# Patient Record
Sex: Female | Born: 1935 | Race: White | Hispanic: No | Marital: Single
Health system: Southern US, Community
[De-identification: ages and names within clinical notes are randomized; demographics above are authoritative.]

## PROBLEM LIST (undated history)

## (undated) DIAGNOSIS — N183 Chronic kidney disease, stage 3 (moderate): Secondary | ICD-10-CM

## (undated) DIAGNOSIS — E559 Vitamin D deficiency, unspecified: Secondary | ICD-10-CM

## (undated) DIAGNOSIS — R532 Functional quadriplegia: Secondary | ICD-10-CM

## (undated) DIAGNOSIS — M216X9 Other acquired deformities of unspecified foot: Secondary | ICD-10-CM

## (undated) DIAGNOSIS — F0280 Dementia in other diseases classified elsewhere without behavioral disturbance: Secondary | ICD-10-CM

## (undated) DIAGNOSIS — E7800 Pure hypercholesterolemia, unspecified: Secondary | ICD-10-CM

## (undated) DIAGNOSIS — G809 Cerebral palsy, unspecified: Secondary | ICD-10-CM

## (undated) DIAGNOSIS — R1319 Other dysphagia: Secondary | ICD-10-CM

## (undated) DIAGNOSIS — G309 Alzheimer's disease, unspecified: Secondary | ICD-10-CM

## (undated) DIAGNOSIS — I2510 Atherosclerotic heart disease of native coronary artery without angina pectoris: Secondary | ICD-10-CM

## (undated) DIAGNOSIS — M2450 Contracture, unspecified joint: Secondary | ICD-10-CM

## (undated) DIAGNOSIS — M1990 Unspecified osteoarthritis, unspecified site: Secondary | ICD-10-CM

## (undated) DIAGNOSIS — D179 Benign lipomatous neoplasm, unspecified: Secondary | ICD-10-CM

## (undated) DIAGNOSIS — I10 Essential (primary) hypertension: Secondary | ICD-10-CM

## (undated) DIAGNOSIS — S42409A Unspecified fracture of lower end of unspecified humerus, initial encounter for closed fracture: Secondary | ICD-10-CM

## (undated) DIAGNOSIS — K5730 Diverticulosis of large intestine without perforation or abscess without bleeding: Secondary | ICD-10-CM

## (undated) DIAGNOSIS — E876 Hypokalemia: Secondary | ICD-10-CM

## (undated) DIAGNOSIS — K449 Diaphragmatic hernia without obstruction or gangrene: Secondary | ICD-10-CM

## (undated) DIAGNOSIS — M810 Age-related osteoporosis without current pathological fracture: Secondary | ICD-10-CM

## (undated) DIAGNOSIS — F329 Major depressive disorder, single episode, unspecified: Secondary | ICD-10-CM

## (undated) DIAGNOSIS — F2089 Other schizophrenia: Secondary | ICD-10-CM

## (undated) DIAGNOSIS — G609 Hereditary and idiopathic neuropathy, unspecified: Secondary | ICD-10-CM

## (undated) DIAGNOSIS — R5381 Other malaise: Secondary | ICD-10-CM

## (undated) DIAGNOSIS — M7981 Nontraumatic hematoma of soft tissue: Secondary | ICD-10-CM

## (undated) DIAGNOSIS — F319 Bipolar disorder, unspecified: Secondary | ICD-10-CM

## (undated) DIAGNOSIS — M545 Low back pain: Secondary | ICD-10-CM

## (undated) DIAGNOSIS — K5900 Constipation, unspecified: Secondary | ICD-10-CM

## (undated) DIAGNOSIS — M4120 Other idiopathic scoliosis, site unspecified: Secondary | ICD-10-CM

## (undated) HISTORY — DX: Functional quadriplegia: R53.2

## (undated) HISTORY — DX: Constipation, unspecified: K59.00

## (undated) HISTORY — DX: Dementia in other diseases classified elsewhere without behavioral disturbance: F02.80

## (undated) HISTORY — DX: Atherosclerotic heart disease of native coronary artery without angina pectoris: I25.10

## (undated) HISTORY — DX: Other idiopathic scoliosis, site unspecified: M41.20

## (undated) HISTORY — DX: Low back pain: M54.5

## (undated) HISTORY — DX: Diaphragmatic hernia without obstruction or gangrene: K44.9

## (undated) HISTORY — DX: Major depressive disorder, single episode, unspecified: F32.9

## (undated) HISTORY — DX: Bipolar disorder, unspecified: F31.9

## (undated) HISTORY — DX: Diverticulosis of large intestine without perforation or abscess without bleeding: K57.30

## (undated) HISTORY — DX: Contracture, unspecified joint: M24.50

## (undated) HISTORY — DX: Other acquired deformities of unspecified foot: M21.6X9

## (undated) HISTORY — DX: Other dysphagia: R13.19

## (undated) HISTORY — DX: Age-related osteoporosis without current pathological fracture: M81.0

## (undated) HISTORY — DX: Pure hypercholesterolemia, unspecified: E78.00

## (undated) HISTORY — DX: Alzheimer's disease, unspecified: G30.9

## (undated) HISTORY — DX: Essential (primary) hypertension: I10

## (undated) HISTORY — DX: Chronic kidney disease, stage 3 (moderate): N18.3

## (undated) HISTORY — DX: Cerebral palsy, unspecified: G80.9

## (undated) HISTORY — DX: Hereditary and idiopathic neuropathy, unspecified: G60.9

## (undated) HISTORY — DX: Unspecified osteoarthritis, unspecified site: M19.90

## (undated) HISTORY — DX: Nontraumatic hematoma of soft tissue: M79.81

## (undated) HISTORY — DX: Unspecified fracture of lower end of unspecified humerus, initial encounter for closed fracture: S42.409A

## (undated) HISTORY — DX: Other schizophrenia: F20.89

## (undated) HISTORY — DX: Benign lipomatous neoplasm, unspecified: D17.9

## (undated) HISTORY — DX: Hypokalemia: E87.6

## (undated) HISTORY — DX: Vitamin D deficiency, unspecified: E55.9

## (undated) HISTORY — DX: Other malaise: R53.81

---

## 1958-11-20 HISTORY — PX: ABDOMINAL HYSTERECTOMY: SHX81

## 1979-10-29 DIAGNOSIS — M4120 Other idiopathic scoliosis, site unspecified: Secondary | ICD-10-CM

## 1979-10-29 DIAGNOSIS — G809 Cerebral palsy, unspecified: Secondary | ICD-10-CM

## 1979-10-29 HISTORY — DX: Cerebral palsy, unspecified: G80.9

## 1979-10-29 HISTORY — DX: Other idiopathic scoliosis, site unspecified: M41.20

## 1998-05-17 ENCOUNTER — Encounter (HOSPITAL_COMMUNITY): Admission: RE | Admit: 1998-05-17 | Discharge: 1998-08-15 | Payer: Self-pay | Admitting: Internal Medicine

## 2000-08-20 DIAGNOSIS — M810 Age-related osteoporosis without current pathological fracture: Secondary | ICD-10-CM

## 2000-08-20 HISTORY — DX: Age-related osteoporosis without current pathological fracture: M81.0

## 2001-12-09 ENCOUNTER — Emergency Department (HOSPITAL_COMMUNITY): Admission: EM | Admit: 2001-12-09 | Discharge: 2001-12-09 | Payer: Self-pay | Admitting: Emergency Medicine

## 2001-12-09 ENCOUNTER — Encounter: Payer: Self-pay | Admitting: Emergency Medicine

## 2002-08-04 DIAGNOSIS — E876 Hypokalemia: Secondary | ICD-10-CM

## 2002-08-04 HISTORY — DX: Hypokalemia: E87.6

## 2003-03-26 DIAGNOSIS — F0280 Dementia in other diseases classified elsewhere without behavioral disturbance: Secondary | ICD-10-CM

## 2003-03-26 DIAGNOSIS — M1990 Unspecified osteoarthritis, unspecified site: Secondary | ICD-10-CM

## 2003-03-26 DIAGNOSIS — I10 Essential (primary) hypertension: Secondary | ICD-10-CM

## 2003-03-26 HISTORY — DX: Dementia in other diseases classified elsewhere without behavioral disturbance: F02.80

## 2003-03-26 HISTORY — DX: Unspecified osteoarthritis, unspecified site: M19.90

## 2003-03-26 HISTORY — DX: Essential (primary) hypertension: I10

## 2003-06-04 DIAGNOSIS — E7800 Pure hypercholesterolemia, unspecified: Secondary | ICD-10-CM

## 2003-06-04 DIAGNOSIS — K5730 Diverticulosis of large intestine without perforation or abscess without bleeding: Secondary | ICD-10-CM

## 2003-06-04 DIAGNOSIS — F329 Major depressive disorder, single episode, unspecified: Secondary | ICD-10-CM

## 2003-06-04 DIAGNOSIS — K449 Diaphragmatic hernia without obstruction or gangrene: Secondary | ICD-10-CM

## 2003-06-04 DIAGNOSIS — D179 Benign lipomatous neoplasm, unspecified: Secondary | ICD-10-CM

## 2003-06-04 HISTORY — DX: Benign lipomatous neoplasm, unspecified: D17.9

## 2003-06-04 HISTORY — DX: Diaphragmatic hernia without obstruction or gangrene: K44.9

## 2003-06-04 HISTORY — DX: Diverticulosis of large intestine without perforation or abscess without bleeding: K57.30

## 2003-06-04 HISTORY — DX: Pure hypercholesterolemia, unspecified: E78.00

## 2003-06-04 HISTORY — DX: Major depressive disorder, single episode, unspecified: F32.9

## 2004-11-26 ENCOUNTER — Emergency Department (HOSPITAL_COMMUNITY): Admission: EM | Admit: 2004-11-26 | Discharge: 2004-11-26 | Payer: Self-pay | Admitting: Emergency Medicine

## 2005-12-17 DIAGNOSIS — F2089 Other schizophrenia: Secondary | ICD-10-CM

## 2005-12-17 HISTORY — DX: Other schizophrenia: F20.89

## 2006-05-05 DIAGNOSIS — M545 Low back pain, unspecified: Secondary | ICD-10-CM

## 2006-05-05 HISTORY — DX: Low back pain, unspecified: M54.50

## 2007-02-22 ENCOUNTER — Emergency Department (HOSPITAL_COMMUNITY): Admission: EM | Admit: 2007-02-22 | Discharge: 2007-02-23 | Payer: Self-pay | Admitting: Emergency Medicine

## 2007-03-12 ENCOUNTER — Inpatient Hospital Stay (HOSPITAL_COMMUNITY): Admission: RE | Admit: 2007-03-12 | Discharge: 2007-03-13 | Payer: Self-pay | Admitting: Orthopedic Surgery

## 2007-03-12 HISTORY — PX: FRACTURE SURGERY: SHX138

## 2008-08-13 DIAGNOSIS — G609 Hereditary and idiopathic neuropathy, unspecified: Secondary | ICD-10-CM

## 2008-08-13 DIAGNOSIS — M216X9 Other acquired deformities of unspecified foot: Secondary | ICD-10-CM

## 2008-08-13 HISTORY — DX: Other acquired deformities of unspecified foot: M21.6X9

## 2008-08-13 HISTORY — DX: Hereditary and idiopathic neuropathy, unspecified: G60.9

## 2008-09-18 IMAGING — CR DG HUMERUS 2V *L*
6 series · 6 of 6 positions shown · non-contrast
Comparison: none

CLINICAL DATA: Left humeral injury with visible deformity. 
 LEFT HUMERUS ? 2 VIEW:

[w transthoracic humerus]
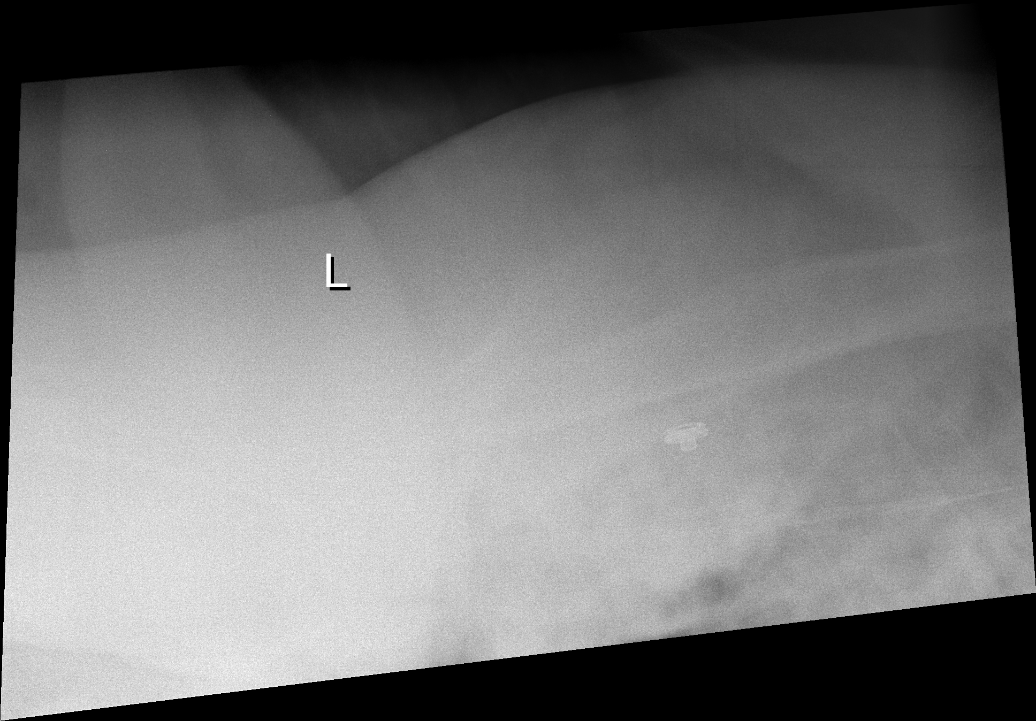

[w transthoracic humerus *]
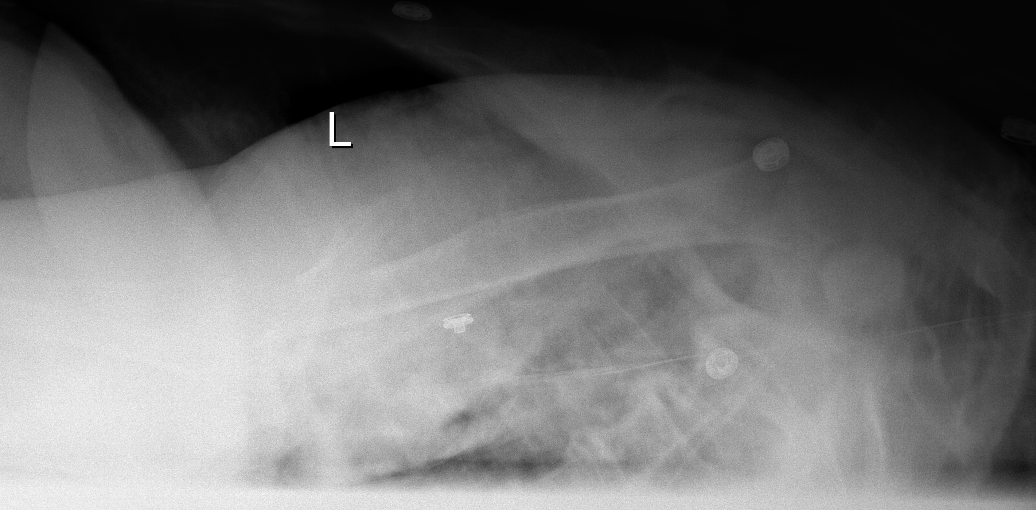

[view not recorded (1 of 4)]
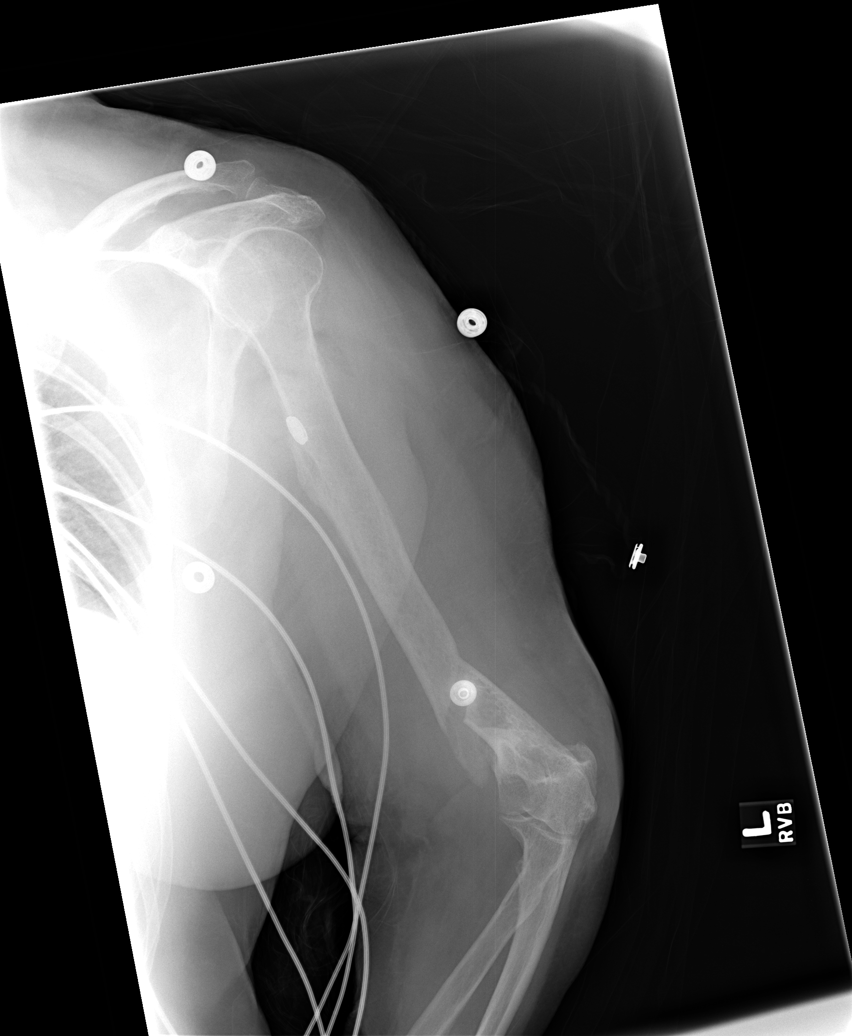

[view not recorded (2 of 4)]
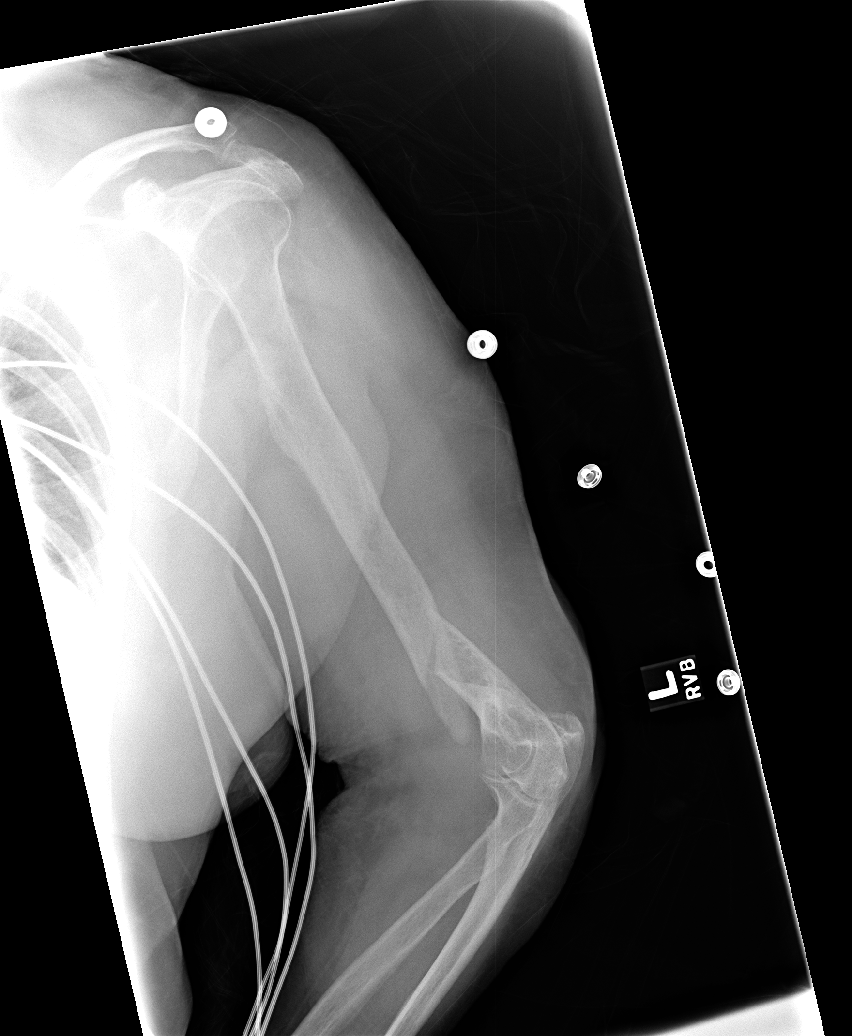

[view not recorded (3 of 4)]
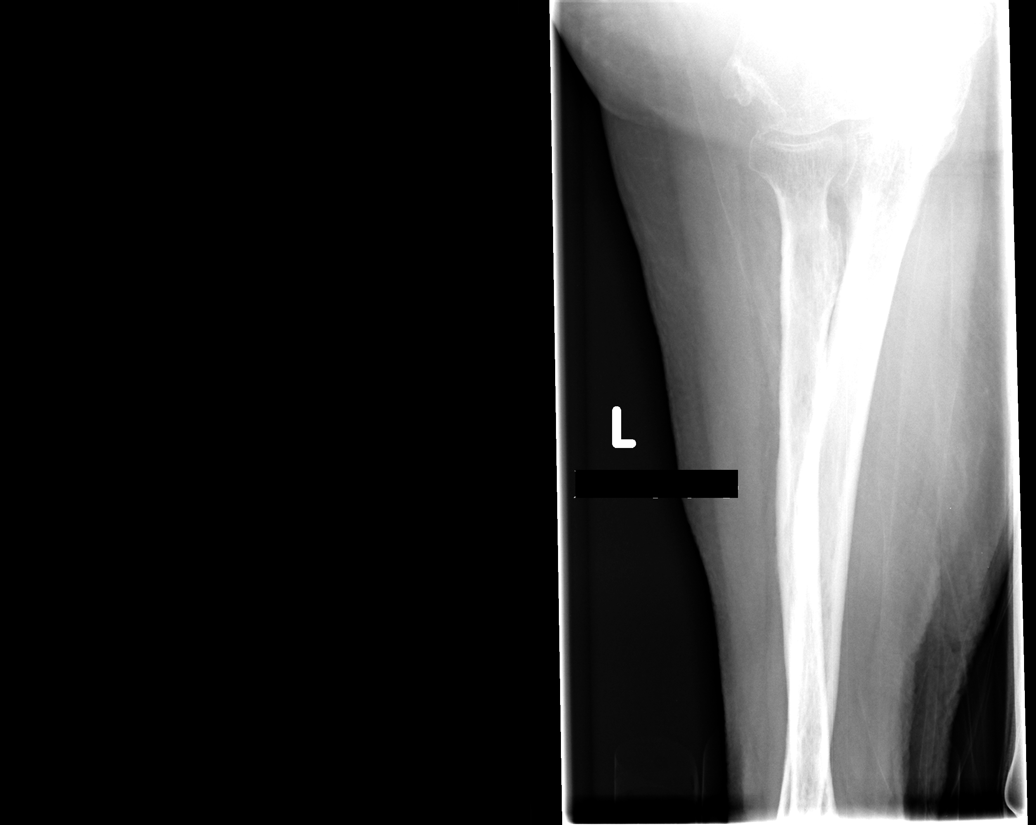

[view not recorded (4 of 4)]
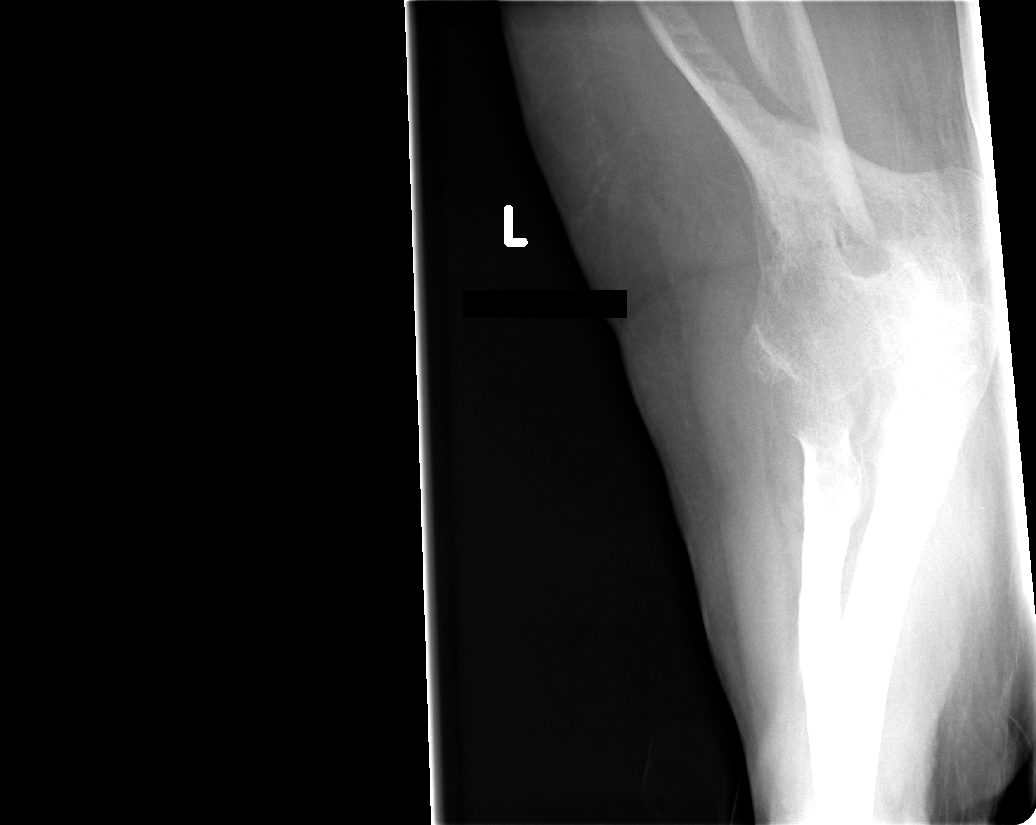

[6 of 6 positions shown; findings below may reference images not displayed]

FINDINGS: There is a displaced and angulated fracture of the distal humerus with distal extent nearly to the supracondylar region.  No dislocation at the elbow.  The proximal humerus is intact.
IMPRESSION: Angulated and displaced distal humeral fracture.

## 2009-03-02 DIAGNOSIS — M2450 Contracture, unspecified joint: Secondary | ICD-10-CM

## 2009-03-02 HISTORY — DX: Contracture, unspecified joint: M24.50

## 2010-06-07 DIAGNOSIS — E559 Vitamin D deficiency, unspecified: Secondary | ICD-10-CM

## 2010-06-07 HISTORY — DX: Vitamin D deficiency, unspecified: E55.9

## 2012-06-10 DIAGNOSIS — I2510 Atherosclerotic heart disease of native coronary artery without angina pectoris: Secondary | ICD-10-CM

## 2012-06-10 DIAGNOSIS — M7981 Nontraumatic hematoma of soft tissue: Secondary | ICD-10-CM

## 2012-06-10 DIAGNOSIS — R1319 Other dysphagia: Secondary | ICD-10-CM

## 2012-06-10 DIAGNOSIS — F319 Bipolar disorder, unspecified: Secondary | ICD-10-CM

## 2012-06-10 DIAGNOSIS — K5900 Constipation, unspecified: Secondary | ICD-10-CM

## 2012-06-10 DIAGNOSIS — R5381 Other malaise: Secondary | ICD-10-CM

## 2012-06-10 HISTORY — DX: Atherosclerotic heart disease of native coronary artery without angina pectoris: I25.10

## 2012-06-10 HISTORY — DX: Bipolar disorder, unspecified: F31.9

## 2012-06-10 HISTORY — DX: Constipation, unspecified: K59.00

## 2012-06-10 HISTORY — DX: Other malaise: R53.81

## 2012-06-10 HISTORY — DX: Nontraumatic hematoma of soft tissue: M79.81

## 2012-06-10 HISTORY — DX: Other dysphagia: R13.19

## 2012-10-16 DIAGNOSIS — N183 Chronic kidney disease, stage 3 unspecified: Secondary | ICD-10-CM

## 2012-10-16 HISTORY — DX: Chronic kidney disease, stage 3 unspecified: N18.30

## 2013-02-17 DIAGNOSIS — F339 Major depressive disorder, recurrent, unspecified: Secondary | ICD-10-CM

## 2013-02-17 DIAGNOSIS — L219 Seborrheic dermatitis, unspecified: Secondary | ICD-10-CM

## 2013-02-17 DIAGNOSIS — M159 Polyosteoarthritis, unspecified: Secondary | ICD-10-CM

## 2013-02-17 DIAGNOSIS — R21 Rash and other nonspecific skin eruption: Secondary | ICD-10-CM

## 2013-03-21 DIAGNOSIS — N183 Chronic kidney disease, stage 3 (moderate): Secondary | ICD-10-CM

## 2013-03-21 DIAGNOSIS — I129 Hypertensive chronic kidney disease with stage 1 through stage 4 chronic kidney disease, or unspecified chronic kidney disease: Secondary | ICD-10-CM

## 2013-03-21 DIAGNOSIS — F2089 Other schizophrenia: Secondary | ICD-10-CM

## 2013-03-21 DIAGNOSIS — D638 Anemia in other chronic diseases classified elsewhere: Secondary | ICD-10-CM

## 2013-03-21 DIAGNOSIS — L219 Seborrheic dermatitis, unspecified: Secondary | ICD-10-CM

## 2013-03-27 ENCOUNTER — Other Ambulatory Visit: Payer: Self-pay | Admitting: Geriatric Medicine

## 2013-03-27 MED ORDER — LORAZEPAM 0.5 MG PO TABS: 0.5000 mg | ORAL_TABLET | Freq: Three times a day (TID) | ORAL | Status: DC

## 2013-03-31 ENCOUNTER — Other Ambulatory Visit: Payer: Self-pay | Admitting: *Deleted

## 2013-03-31 MED ORDER — HYDROCODONE-ACETAMINOPHEN 5-325 MG PO TABS: ORAL_TABLET | ORAL | Status: DC

## 2013-04-16 ENCOUNTER — Other Ambulatory Visit: Payer: Self-pay | Admitting: Geriatric Medicine

## 2013-04-16 MED ORDER — LORAZEPAM 0.5 MG PO TABS: ORAL_TABLET | ORAL | Status: DC

## 2013-04-24 DIAGNOSIS — R532 Functional quadriplegia: Secondary | ICD-10-CM

## 2013-04-24 DIAGNOSIS — F0280 Dementia in other diseases classified elsewhere without behavioral disturbance: Secondary | ICD-10-CM

## 2013-04-24 DIAGNOSIS — F339 Major depressive disorder, recurrent, unspecified: Secondary | ICD-10-CM

## 2013-04-24 DIAGNOSIS — L219 Seborrheic dermatitis, unspecified: Secondary | ICD-10-CM

## 2013-04-24 DIAGNOSIS — G309 Alzheimer's disease, unspecified: Secondary | ICD-10-CM

## 2013-05-30 ENCOUNTER — Encounter: Payer: Self-pay | Admitting: *Deleted

## 2013-06-02 ENCOUNTER — Encounter: Payer: Self-pay | Admitting: *Deleted

## 2013-06-09 ENCOUNTER — Non-Acute Institutional Stay (SKILLED_NURSING_FACILITY): Payer: PRIVATE HEALTH INSURANCE | Admitting: Internal Medicine

## 2013-06-09 DIAGNOSIS — F200 Paranoid schizophrenia: Secondary | ICD-10-CM

## 2013-06-09 DIAGNOSIS — I10 Essential (primary) hypertension: Secondary | ICD-10-CM

## 2013-06-09 DIAGNOSIS — N184 Chronic kidney disease, stage 4 (severe): Secondary | ICD-10-CM

## 2013-06-09 DIAGNOSIS — E7800 Pure hypercholesterolemia, unspecified: Secondary | ICD-10-CM

## 2013-06-09 NOTE — Progress Notes (Signed)
Patient ID: Kristen Owens, female   DOB: Dec 16, 1929, 79 y.o.   MRN: [id number]  [facility]  Chief Complaint  Patient presents with  . Medical Managment of Chronic Issues   Allergies  Allergen Reactions  . Carbapenems   . Cephalosporins   . Penicillamine   . Penicillins    Code status- DNR  HPI 79 y/o female is a LTC resident here. She is at her functional baseline and there are no new concerns from the staff. She is working with restorative team. She is under total care.  No falls or any behavioral changes reported.  Unable to obtain ROS from her but as per staff, she is able to make her needs known  Past Medical History  Diagnosis Date  . Chronic kidney disease, stage III (moderate) 10/16/2012  . Bipolar I disorder, most recent episode (or current) unspecified 06/10/2012  . Coronary atherosclerosis of native coronary artery 06/10/2012  . Unspecified constipation 06/10/2012  . Nontraumatic hematoma of soft tissue 06/10/2012  . Other dysphagia 06/10/2012  . Debility, unspecified 06/10/2012  . Unspecified vitamin D deficiency 06/07/2010  . Contracture of joint 03/02/2009  . Unspecified hereditary and idiopathic peripheral neuropathy 08/13/2008  . Other acquired deformity of ankle and foot(736.79) 08/13/2008  . Closed fracture of unspecified part of lower end of humerus 08/13/2008`  . Lumbago 05/05/2006  . Simple schizophrenia, unspecified condition 12/17/2005  . Lipoma of unspecified site 06/04/2003  . Pure hypercholesterolemia 06/04/2003  . Depressive disorder, not elsewhere classified 06/04/2003  . Diaphragmatic hernia without mention of obstruction or gangrene 06/04/2003  . Diverticulosis of colon (without mention of hemorrhage) 06/04/2003  . Alzheimer's disease 03/26/2003  . Unspecified essential hypertension 03/26/2003  . Osteoarthrosis, unspecified whether generalized or localized, unspecified site 03/26/2003  . Hypopotassemia 08/04/2002  . Osteoporosis,  unspecified 08/20/2000  . Infantile cerebral palsy, unspecified 10/29/1979  . Scoliosis (and kyphoscoliosis), idiopathic 10/29/1979  . Functional quadriplegia    Physical Exam  Constitutional: She appears well-developed. No distress.  In bed, has garbled speech  HENT:  Head: Normocephalic and atraumatic.  Mouth/Throat: Oropharynx is clear and moist.  Eyes: Conjunctivae are normal.  Neck: Normal range of motion. Neck supple. No tracheal deviation present.  Cardiovascular: Normal rate, regular rhythm and intact distal pulses.   Pulmonary/Chest: Effort normal and breath sounds normal. No respiratory distress.  Abdominal: Soft. Bowel sounds are normal. She exhibits no mass.  Musculoskeletal: She exhibits no edema.  Contracture of LUE, right 2nd PIP and 3-5 th fingers contracted, b/l foot drop, unable to move LE  Lymphadenopathy:    She has no cervical adenopathy.  Neurological:  Calm and awake, able to answer simple questions  Skin: Skin is warm and dry. She is not diaphoretic.   VS- bp 130/65, HR 59/min, rr 16/min  Labs and medication reviewed  Assessment/plan  HTN- bp well controlled at present. Monitor renal function. Continue cozaar and kcl supplement.   Schizophrenia- calm at present, continue risperdal and lamictal with prn ativan  Osteoporosis, fall precautions, continue ca-vit d  Hyperlipidemia- continue zocor and monitor flp periodically  ckd stage 3- avoid nsaids, continue cozaar, monitor bmp

## 2013-06-11 DIAGNOSIS — E7800 Pure hypercholesterolemia, unspecified: Secondary | ICD-10-CM | POA: Insufficient documentation

## 2013-06-11 DIAGNOSIS — F0280 Dementia in other diseases classified elsewhere without behavioral disturbance: Secondary | ICD-10-CM | POA: Insufficient documentation

## 2013-06-11 DIAGNOSIS — G3183 Dementia with Lewy bodies: Secondary | ICD-10-CM

## 2013-06-11 DIAGNOSIS — N184 Chronic kidney disease, stage 4 (severe): Secondary | ICD-10-CM | POA: Insufficient documentation

## 2013-06-11 DIAGNOSIS — E785 Hyperlipidemia, unspecified: Secondary | ICD-10-CM | POA: Insufficient documentation

## 2013-06-11 DIAGNOSIS — M810 Age-related osteoporosis without current pathological fracture: Secondary | ICD-10-CM | POA: Insufficient documentation

## 2013-06-11 DIAGNOSIS — I10 Essential (primary) hypertension: Secondary | ICD-10-CM | POA: Insufficient documentation

## 2013-06-11 DIAGNOSIS — F329 Major depressive disorder, single episode, unspecified: Secondary | ICD-10-CM | POA: Insufficient documentation

## 2013-06-11 DIAGNOSIS — K5790 Diverticulosis of intestine, part unspecified, without perforation or abscess without bleeding: Secondary | ICD-10-CM | POA: Insufficient documentation

## 2013-06-11 DIAGNOSIS — K449 Diaphragmatic hernia without obstruction or gangrene: Secondary | ICD-10-CM | POA: Insufficient documentation

## 2013-06-11 DIAGNOSIS — G808 Other cerebral palsy: Secondary | ICD-10-CM | POA: Insufficient documentation

## 2013-06-11 DIAGNOSIS — F319 Bipolar disorder, unspecified: Secondary | ICD-10-CM | POA: Insufficient documentation

## 2013-06-11 DIAGNOSIS — F200 Paranoid schizophrenia: Secondary | ICD-10-CM | POA: Insufficient documentation

## 2013-07-14 ENCOUNTER — Non-Acute Institutional Stay (SKILLED_NURSING_FACILITY): Payer: PRIVATE HEALTH INSURANCE | Admitting: Internal Medicine

## 2013-07-14 DIAGNOSIS — E7800 Pure hypercholesterolemia, unspecified: Secondary | ICD-10-CM

## 2013-07-14 DIAGNOSIS — F200 Paranoid schizophrenia: Secondary | ICD-10-CM

## 2013-07-14 DIAGNOSIS — K5900 Constipation, unspecified: Secondary | ICD-10-CM

## 2013-07-14 DIAGNOSIS — R532 Functional quadriplegia: Secondary | ICD-10-CM

## 2013-07-14 DIAGNOSIS — E876 Hypokalemia: Secondary | ICD-10-CM

## 2013-07-14 DIAGNOSIS — M810 Age-related osteoporosis without current pathological fracture: Secondary | ICD-10-CM

## 2013-07-14 DIAGNOSIS — Z0000 Encounter for general adult medical examination without abnormal findings: Secondary | ICD-10-CM

## 2013-07-14 DIAGNOSIS — E785 Hyperlipidemia, unspecified: Secondary | ICD-10-CM

## 2013-07-14 DIAGNOSIS — I10 Essential (primary) hypertension: Secondary | ICD-10-CM

## 2013-07-14 DIAGNOSIS — K219 Gastro-esophageal reflux disease without esophagitis: Secondary | ICD-10-CM

## 2013-07-14 DIAGNOSIS — N184 Chronic kidney disease, stage 4 (severe): Secondary | ICD-10-CM

## 2013-07-14 NOTE — Progress Notes (Signed)
Patient ID: Kristen Owens, female   DOB: 10/08/1930, 78 y.o.   MRN: [id number]  [facility]    Code Status:   Allergies  Allergen Reactions  . Carbapenems   . Cephalosporins   . Penicillamine   . Penicillins     Chief Complaint: dnr   HPI 79 y/o female is a LTC resident here. She is at her functional baseline and there are no new concerns from the staff. She is working with restorative team. She is under total care.  No falls or any behavioral changes reported.  Unable to obtain ROS from her but as per staff, she is able to make her needs known. Weight has been stable. She is not in any distress.    uptodate with flu and pneumococcal vaccine. uptodate with eye and foot exam  Past Medical History  Diagnosis Date  . Chronic kidney disease, stage III (moderate) 10/16/2012  . Bipolar I disorder, most recent episode (or current) unspecified 06/10/2012  . Coronary atherosclerosis of native coronary artery 06/10/2012  . Unspecified constipation 06/10/2012  . Nontraumatic hematoma of soft tissue 06/10/2012  . Other dysphagia 06/10/2012  . Debility, unspecified 06/10/2012  . Unspecified vitamin D deficiency 06/07/2010  . Contracture of joint 03/02/2009  . Unspecified hereditary and idiopathic peripheral neuropathy 08/13/2008  . Other acquired deformity of ankle and foot(736.79) 08/13/2008  . Closed fracture of unspecified part of lower end of humerus 08/13/2008`  . Lumbago 05/05/2006  . Simple schizophrenia, unspecified condition 12/17/2005  . Lipoma of unspecified site 06/04/2003  . Pure hypercholesterolemia 06/04/2003  . Depressive disorder, not elsewhere classified 06/04/2003  . Diaphragmatic hernia without mention of obstruction or gangrene 06/04/2003  . Diverticulosis of colon (without mention of hemorrhage) 06/04/2003  . Alzheimer's disease 03/26/2003  . Unspecified essential hypertension 03/26/2003  . Osteoarthrosis, unspecified whether generalized or localized,  unspecified site 03/26/2003  . Hypopotassemia 08/04/2002  . Osteoporosis, unspecified 08/20/2000  . Infantile cerebral palsy, unspecified 10/29/1979  . Scoliosis (and kyphoscoliosis), idiopathic 10/29/1979  . Functional quadriplegia    Past Surgical History  Procedure Laterality Date  . Abdominal hysterectomy  1960  . Fracture surgery  03/12/2007   Social History:   has no tobacco, alcohol, and drug history on file.  No family history on file.  Medications: Patient's Medications  New Prescriptions   No medications on file  Previous Medications   ACETAMINOPHEN (TYLENOL) 500 MG TABLET    Take 500 mg by mouth every 6 (six) hours as needed for pain. Take 1-2 tablets every 6 hours as needed for pain. One tablet for mild pain, two tablets for moderate pain.   ASPIRIN 81 MG TABLET    Take 81 mg by mouth daily. Take 1 tablet daily for heart health.   CHOLECALCIFEROL 2000 UNITS CAPS    Take 1 capsule by mouth daily.   DICLOFENAC SODIUM (VOLTAREN) 1 % GEL    Apply topically. Use for pain.   FLUOXETINE (PROZAC) 20 MG TABLET    Take 10 mg by mouth daily.    HYDROCODONE-ACETAMINOPHEN (NORCO/VICODIN) 5-325 MG PER TABLET    Take one tablet by mouth twice daily for pain; Take one tablet by mouth every 6 hours as needed for severe pain.   LAMOTRIGINE (LAMICTAL) 150 MG TABLET    Take 150 mg by mouth daily.   LORAZEPAM (ATIVAN) 0.5 MG TABLET    Take one tablet by mouth three times a day for anxiety; Take one tablet by mouth every 8 hours as  needed for anxiety.   LOSARTAN (COZAAR) 50 MG TABLET    Take 25 mg by mouth daily.    OMEPRAZOLE (PRILOSEC) 20 MG CAPSULE    Take 20 mg by mouth daily.   POTASSIUM CHLORIDE (K-DUR) 10 MEQ TABLET    Take 10 mEq by mouth daily.   RISPERIDONE (RISPERDAL) 1 MG TABLET    Take 1 mg by mouth daily. 1 tab in am and 1 and half tab in pm   SENNA-DOCUSATE (SENNA S) 8.6-50 MG PER TABLET    Take 1 tablet by mouth 2 (two) times daily.    SIMVASTATIN (ZOCOR) 5 MG TABLET    Take  5 mg by mouth at bedtime. Take 1 tablet daily for cholesterol.  Modified Medications   No medications on file  Discontinued Medications   RISPERIDONE (RISPERDAL) 3 MG TABLET    Take 3 mg by mouth 2 (two) times daily. Take 1 tablet twice daily for mood d     Physical Exam: Filed Vitals:   07/14/13 0928  BP: 100/60  Pulse: 72  Temp: 96.8 F (36 C)  Resp: 18  SpO2: 95%   Constitutional: She appears well-developed. No distress.  In bed, has garbled speech  HENT:   Head: Normocephalic and atraumatic.   Mouth/Throat: Oropharynx is clear and moist.  Eyes: Conjunctivae are normal.  Neck: Normal range of motion. Neck supple. No tracheal deviation present.  Cardiovascular: Normal rate, regular rhythm and intact distal pulses.   Pulmonary/Chest: Effort normal and breath sounds normal. No respiratory distress.  Abdominal: Soft. Bowel sounds are normal. She exhibits no mass.  Musculoskeletal: She exhibits no edema.  Contracture of LUE, right 2nd PIP and 3-5 th fingers contracted, b/l foot drop, unable to move LE  Lymphadenopathy:    She has no cervical adenopathy.  Neurological:  Calm and awake, able to answer simple questions  Skin: Skin is warm and dry. She is not diaphoretic.    Labs reviewed: 02/11/13 b12 520, wbc 7.1, hb 12.1, hct 37.0, mcv 91.1, ca 9.4, na 139, k 4.2, bun 23, cr 0.82, glu 97, chol 159, tg 105, ldl 96, hdl 42 a1c 5.15.14 5.2  Assessment/plan  HTN- bp on lower side of normal. On cozaar 50 mg daily. Will decrease it to 25 mg daily for now and monitor bp  Osteoporosis-  Continue ca-vit d supplement, fall precautions  General exam- reviewed routine labs and immunizations. Under total care. Continue skin care, fall precautions  Schizophrenia- continue lamictal, fluoxetine and risperidal with ativan for now, monitor clinically  CKD stage 3- continue current medication regimen. Avoid NSAIDS.   Hyperlipidemia- reviewed lipid panel. Continue zocor  Constipation-  continue senna -s for now  Hypokalemia- continue kcl supplement, monitor bmp periodically  GERD- stable, continue prilosec  Quadriplegia- continue oxycodone for pain prn, bp stable - med adjustment made. Continue asa and statin  Labs/tests ordered- cbc, bmp

## 2013-09-05 ENCOUNTER — Other Ambulatory Visit: Payer: Self-pay | Admitting: *Deleted

## 2013-09-05 MED ORDER — LORAZEPAM 0.5 MG PO TABS: ORAL_TABLET | ORAL | Status: DC

## 2013-09-05 NOTE — Telephone Encounter (Signed)
rx filled per protocol  

## 2013-09-15 ENCOUNTER — Other Ambulatory Visit: Payer: Self-pay | Admitting: *Deleted

## 2013-09-15 MED ORDER — LORAZEPAM 0.5 MG PO TABS: ORAL_TABLET | ORAL | Status: DC

## 2013-09-18 ENCOUNTER — Non-Acute Institutional Stay (SKILLED_NURSING_FACILITY): Payer: PRIVATE HEALTH INSURANCE | Admitting: Internal Medicine

## 2013-09-18 DIAGNOSIS — N189 Chronic kidney disease, unspecified: Secondary | ICD-10-CM

## 2013-09-18 DIAGNOSIS — R532 Functional quadriplegia: Secondary | ICD-10-CM

## 2013-09-18 DIAGNOSIS — I129 Hypertensive chronic kidney disease with stage 1 through stage 4 chronic kidney disease, or unspecified chronic kidney disease: Secondary | ICD-10-CM

## 2013-09-18 DIAGNOSIS — F329 Major depressive disorder, single episode, unspecified: Secondary | ICD-10-CM

## 2013-09-18 DIAGNOSIS — F200 Paranoid schizophrenia: Secondary | ICD-10-CM

## 2013-09-18 DIAGNOSIS — F3289 Other specified depressive episodes: Secondary | ICD-10-CM

## 2013-09-18 DIAGNOSIS — I10 Essential (primary) hypertension: Secondary | ICD-10-CM | POA: Insufficient documentation

## 2013-09-18 DIAGNOSIS — E7800 Pure hypercholesterolemia, unspecified: Secondary | ICD-10-CM

## 2013-09-18 NOTE — Progress Notes (Signed)
Patient ID: Kristen Owens, female   DOB: 12-Apr-1930, 79 y.o.   MRN: [id number]  [facility]  Chief complaint- medical management of chronic illness  Allergies reviewed  Code status- DNR  HPI 79 y/o female is a LTC resident here. She is at her functional baseline and has occassional yelling and calling out names episode. Her ativan was decreased recently as she was somnolent most of the time of the day and now with dose reduction, she has been more awake. She is under total care.  No falls reported. She is tolerating puree diet well  Unable to obtain ROS from her but as per staff, she is able to make her needs known. Weight has been stable. She is not in any distress.    Past Medical History  Diagnosis Date  . Chronic kidney disease, stage III (moderate) 10/16/2012  . Bipolar I disorder, most recent episode (or current) unspecified 06/10/2012  . Coronary atherosclerosis of native coronary artery 06/10/2012  . Unspecified constipation 06/10/2012  . Nontraumatic hematoma of soft tissue 06/10/2012  . Other dysphagia 06/10/2012  . Debility, unspecified 06/10/2012  . Unspecified vitamin D deficiency 06/07/2010  . Contracture of joint 03/02/2009  . Unspecified hereditary and idiopathic peripheral neuropathy 08/13/2008  . Other acquired deformity of ankle and foot(736.79) 08/13/2008  . Closed fracture of unspecified part of lower end of humerus 08/13/2008`  . Lumbago 05/05/2006  . Simple schizophrenia, unspecified condition 12/17/2005  . Lipoma of unspecified site 06/04/2003  . Pure hypercholesterolemia 06/04/2003  . Depressive disorder, not elsewhere classified 06/04/2003  . Diaphragmatic hernia without mention of obstruction or gangrene 06/04/2003  . Diverticulosis of colon (without mention of hemorrhage) 06/04/2003  . Alzheimer's disease 03/26/2003  . Unspecified essential hypertension 03/26/2003  . Osteoarthrosis, unspecified whether generalized or localized,  unspecified site 03/26/2003  . Hypopotassemia 08/04/2002  . Osteoporosis, unspecified 08/20/2000  . Infantile cerebral palsy, unspecified 10/29/1979  . Scoliosis (and kyphoscoliosis), idiopathic 10/29/1979  . Functional quadriplegia    Past Surgical History  Procedure Laterality Date  . Abdominal hysterectomy  1960  . Fracture surgery  03/12/2007   Medication reviewed. See [facility]  Physical exam  Nursing note and vital signs reviewed   BP 170/94  Pulse 71  Temp(Src) 98.5 F (36.9 C)  Resp 18  Constitutional: She appears well-developed. No distress.  In bed, has garbled speech   HENT:   Head: Normocephalic and atraumatic.   Mouth/Throat: Oropharynx is clear and moist.   Eyes: Conjunctivae are normal.   Neck: Normal range of motion. Neck supple. No tracheal deviation present.   Cardiovascular: Normal rate, regular rhythm and intact distal pulses.    Pulmonary/Chest: Effort normal and breath sounds normal. No respiratory distress.   Abdominal: Soft. Bowel sounds are normal. She exhibits no mass.  Musculoskeletal: She exhibits no edema.  Contracture of LUE, right 2nd PIP and 3-5 th fingers contracted, b/l foot drop, unable to move LE  Lymphadenopathy:    She has no cervical adenopathy.  Neurological:  Calm and awake, able to answer simple questions   Skin: Skin is warm and dry. She is not diaphoretic.   Labs reviewed: 02/11/13 b12 520, wbc 7.1, hb 12.1, hct 37.0, mcv 91.1, ca 9.4, na 139, k 4.2, bun 23, cr 0.82, glu 97, chol 159, tg 105, ldl 96, hdl 42 a1c 5.15.14 5.2  Assessment/plan  HTN- bp elevated this visit.she is on cozaar 25 mg daily. Will add amlodipine 5 mg po daily, check bp  daily for now and readjust dose if needed. Goal < 140/90  Psychosis- recent frequent episodes of yelling. Given that benzodiazepine had made her sleepy, will avoid benzos. Will start her on seroquel 12.5 mg bid and reassess. Given standing and prn ativan when needed  Schizophrenia- continue  lamictal 150 mg daily, fluoxetine 10 mg daily and risperidal 1 mg in am and 1.5 mg in pm daily. Continue prn 0.25 mg q8 hr prn   GERD- stable, with her being bed bound will continue prilosec  Osteoporosis-  Continue ca-vit d supplement, fall precautions  Quadriplegia- continue hydrocodone-apap 5-325 bid and prn norco oxycodone for pain prn. Continue asa and statin  CKD stage 3- continue current medication regimen. Avoid NSAIDS.   Hyperlipidemia- Continue zocor  Constipation- continue senna -s for now  Hypokalemia- continue kcl supplement, monitor bmp periodically

## 2013-10-23 ENCOUNTER — Non-Acute Institutional Stay (SKILLED_NURSING_FACILITY): Payer: PRIVATE HEALTH INSURANCE | Admitting: Internal Medicine

## 2013-10-23 DIAGNOSIS — N189 Chronic kidney disease, unspecified: Secondary | ICD-10-CM

## 2013-10-23 DIAGNOSIS — I129 Hypertensive chronic kidney disease with stage 1 through stage 4 chronic kidney disease, or unspecified chronic kidney disease: Secondary | ICD-10-CM

## 2013-10-23 DIAGNOSIS — M810 Age-related osteoporosis without current pathological fracture: Secondary | ICD-10-CM

## 2013-10-23 DIAGNOSIS — F200 Paranoid schizophrenia: Secondary | ICD-10-CM

## 2013-10-23 DIAGNOSIS — R532 Functional quadriplegia: Secondary | ICD-10-CM

## 2013-10-23 NOTE — Progress Notes (Signed)
Patient ID: Kristen Owens, female   DOB: Jul 19, 1930, 79 y.o.   MRN: [id number]   [facility]  Chief complaint- medical management of chronic illness  Allergies reviewed  Code status- DNR  HPI 79 y/o female is a seen for routine visit. She is at her functional baseline and is under total care. She required her ativan intermittently. No new skin concern. She gets pureed diet. No falls reported. Unable to obtain ROS from her but as per staff, she is able to make her needs known. Weight has been stable. She is not in any distress  Past Medical History  Diagnosis Date  . Chronic kidney disease, stage III (moderate) 10/16/2012  . Bipolar I disorder, most recent episode (or current) unspecified 06/10/2012  . Coronary atherosclerosis of native coronary artery 06/10/2012  . Unspecified constipation 06/10/2012  . Nontraumatic hematoma of soft tissue 06/10/2012  . Other dysphagia 06/10/2012  . Debility, unspecified 06/10/2012  . Unspecified vitamin D deficiency 06/07/2010  . Contracture of joint 03/02/2009  . Unspecified hereditary and idiopathic peripheral neuropathy 08/13/2008  . Other acquired deformity of ankle and foot(736.79) 08/13/2008  . Closed fracture of unspecified part of lower end of humerus 08/13/2008`  . Lumbago 05/05/2006  . Simple schizophrenia, unspecified condition 12/17/2005  . Lipoma of unspecified site 06/04/2003  . Pure hypercholesterolemia 06/04/2003  . Depressive disorder, not elsewhere classified 06/04/2003  . Diaphragmatic hernia without mention of obstruction or gangrene 06/04/2003  . Diverticulosis of colon (without mention of hemorrhage) 06/04/2003  . Alzheimer's disease 03/26/2003  . Unspecified essential hypertension 03/26/2003  . Osteoarthrosis, unspecified whether generalized or localized, unspecified site 03/26/2003  . Hypopotassemia 08/04/2002  . Osteoporosis, unspecified 08/20/2000  . Infantile cerebral palsy, unspecified  10/29/1979  . Scoliosis (and kyphoscoliosis), idiopathic 10/29/1979  . Functional quadriplegia    Past Surgical History  Procedure Laterality Date  . Abdominal hysterectomy  1960  . Fracture surgery  03/12/2007   Medication reviewed. See [facility]  Physical exam BP 135/81  Pulse 76  Temp(Src) 97.4 F (36.3 C)  Resp 16  Constitutional: She appears well-developed. No distress.  In bed, has garbled speech   HENT:   Head: Normocephalic and atraumatic.   Mouth/Throat: Oropharynx is clear and moist.   Eyes: Conjunctivae are normal.   Neck: Normal range of motion. Neck supple. No tracheal deviation present.   Cardiovascular: Normal rate, regular rhythm and intact distal pulses.    Pulmonary/Chest: Effort normal and breath sounds normal. No respiratory distress.   Abdominal: Soft. Bowel sounds are normal. She exhibits no mass.  Musculoskeletal: She exhibits no edema.  Contracture of LUE, right 2nd PIP and 3-5 th fingers contracted, b/l foot drop, unable to move LE  Lymphadenopathy:    She has no cervical adenopathy.  Neurological:  Calm and awake, able to answer simple questions   Skin: Skin is warm and dry. She is not diaphoretic.   Labs reviewed: 02/11/13 b12 520, wbc 7.1, hb 12.1, hct 37.0, mcv 91.1, ca 9.4, na 139, k 4.2, bun 23, cr 0.82, glu 97, chol 159, tg 105, ldl 96, hdl 42 a1c 5.15.14 5.2  Assessment/plan  Schizophrenia- continue lamictal, fluoxetine, risperidal and prn ativan. Monitor clinically. Calm this visitHTN- bp elevated this visit.she is on cozaar 25 mg daily. Will add amlodipine 5 mg po daily, check bp daily for now and readjust dose if needed. Goal < 140/90  GERD- stable on prilosec  Quadriplegia- continue hydrocodone-apap 5-325 bid and prn norco oxycodone for  pain prn. Continue asa and statin. bp under control  Osteoporosis-  Continue ca-vit d supplement, fall precautions

## 2013-10-30 ENCOUNTER — Other Ambulatory Visit: Payer: Self-pay | Admitting: *Deleted

## 2013-10-30 MED ORDER — LORAZEPAM 0.5 MG PO TABS: ORAL_TABLET | ORAL | Status: DC

## 2013-11-10 ENCOUNTER — Other Ambulatory Visit: Payer: Self-pay | Admitting: *Deleted

## 2013-11-10 MED ORDER — HYDROCODONE-ACETAMINOPHEN 5-325 MG PO TABS: ORAL_TABLET | ORAL | Status: DC

## 2013-12-08 ENCOUNTER — Non-Acute Institutional Stay (SKILLED_NURSING_FACILITY): Payer: PRIVATE HEALTH INSURANCE | Admitting: Internal Medicine

## 2013-12-08 ENCOUNTER — Encounter: Payer: Self-pay | Admitting: Internal Medicine

## 2013-12-08 DIAGNOSIS — I10 Essential (primary) hypertension: Secondary | ICD-10-CM

## 2013-12-08 DIAGNOSIS — F200 Paranoid schizophrenia: Secondary | ICD-10-CM

## 2013-12-08 DIAGNOSIS — M810 Age-related osteoporosis without current pathological fracture: Secondary | ICD-10-CM

## 2013-12-08 DIAGNOSIS — E785 Hyperlipidemia, unspecified: Secondary | ICD-10-CM

## 2013-12-08 DIAGNOSIS — R532 Functional quadriplegia: Secondary | ICD-10-CM

## 2013-12-08 DIAGNOSIS — K219 Gastro-esophageal reflux disease without esophagitis: Secondary | ICD-10-CM | POA: Insufficient documentation

## 2013-12-08 NOTE — Progress Notes (Signed)
Patient ID: Kristen Owens, female   DOB: 05/09/1930, 80 y.o.   MRN: [id number]    [facility]  Chief complaint- medical management of chronic illness  Allergies reviewed  Code status- DNR  HPI 80 y/o female is a seen for routine visit. She is at her functional baseline and is under total care. She gets pureed diet. No falls reported. New pressure ulcer in her bottom Unable to obtain ROS from her but as per staff, she is able to make her needs known. Weight has been stable. She is not in any distress  ROS Unable to obtain  Medication reviewed. See [facility]  Physical exam BP 120/70  Pulse 74  Temp(Src) 97.2 F (36.2 C)  Resp 18  SpO2 95%  Constitutional: She appears well-developed. No distress.  In bed, has garbled speech   HENT:   Head: Normocephalic and atraumatic.   Mouth/Throat: Oropharynx is clear and moist.   Eyes: Conjunctivae are normal.   Neck: Normal range of motion. Neck supple. No tracheal deviation present.   Cardiovascular: Normal rate, regular rhythm and intact distal pulses.    Pulmonary/Chest: Effort normal and breath sounds normal. No respiratory distress.   Abdominal: Soft. Bowel sounds are normal. She exhibits no mass.  Musculoskeletal: She exhibits no edema.  Contracture of LUE, right 2nd PIP and 3-5 th fingers contracted, b/l foot drop, unable to move LE  Lymphadenopathy:    She has no cervical adenopathy.  Neurological:  Calm and awake, able to answer simple questions   Skin: Skin is warm and dry. She is not diaphoretic. Pressure ulcer in her buttock  Labs reviewed: 02/11/13 b12 520, wbc 7.1, hb 12.1, hct 37.0, mcv 91.1, ca 9.4, na 139, k 4.2, bun 23, cr 0.82, glu 97, chol 159, tg 105, ldl 96, hdl 42 a1c 5.15.14 5.2 08/08/13 b12 349, wbc 7.4, hb 11.7, hct 38.2, plt 260, ca 9.1, na 138, k 4.2, bun 23, cr 0.7, glu 96, t.chol 165, tg 186, ldl 90, hdl 38  Assessment/plan  HTN- continue cozaar 25 mg daily with amlodipine 5 mg po  daily, continue aspirin  bp better controlled from last visit.  GERD- stable on prilosec, monitor clinically  Schizophrenia- continue lamictal, fluoxetine, risperidal and prn ativan. Monitor clinically. Calm this visit  Quadriplegia- continue hydrocodone-apap 5-325 bid and prn norco oxycodone for pain prn. Continue asa and statin. bp under control  Osteoporosis-  Continue ca-vit d supplement, fall precautions  Hypokalemia- normal k level. D/c kcl supplement for now. Monitor bmp periodically  Hyperlipidemia- continue zocor 5 mg daily for now

## 2014-01-01 ENCOUNTER — Encounter: Payer: Self-pay | Admitting: Internal Medicine

## 2014-01-01 ENCOUNTER — Non-Acute Institutional Stay (SKILLED_NURSING_FACILITY): Payer: PRIVATE HEALTH INSURANCE | Admitting: Internal Medicine

## 2014-01-01 DIAGNOSIS — G8929 Other chronic pain: Secondary | ICD-10-CM | POA: Insufficient documentation

## 2014-01-01 DIAGNOSIS — K219 Gastro-esophageal reflux disease without esophagitis: Secondary | ICD-10-CM

## 2014-01-01 DIAGNOSIS — F200 Paranoid schizophrenia: Secondary | ICD-10-CM

## 2014-01-01 DIAGNOSIS — M810 Age-related osteoporosis without current pathological fracture: Secondary | ICD-10-CM

## 2014-01-01 DIAGNOSIS — E538 Deficiency of other specified B group vitamins: Secondary | ICD-10-CM

## 2014-01-01 DIAGNOSIS — I10 Essential (primary) hypertension: Secondary | ICD-10-CM

## 2014-01-01 DIAGNOSIS — K5900 Constipation, unspecified: Secondary | ICD-10-CM

## 2014-01-01 DIAGNOSIS — R532 Functional quadriplegia: Secondary | ICD-10-CM

## 2014-01-01 NOTE — Progress Notes (Signed)
Patient ID: Kristen Owens, female   DOB: Jan 23, 1930, 80 y.o.   MRN: [id number]    [facility]  Chief complaint- medical management of chronic illness  Allergies reviewed  Code status- DNR  HPI 80 y/o female is a seen for annual visit. She is at her functional baseline and is under total care. She gets pureed diet. No falls reported. She is getting skin care and is on vitamin c and zinc supplement Unable to obtain ROS from her but as per staff, she is able to make her needs known. Weight has been stable. She is not in any distress. She is having more pain recently during change of positions and even at rest in her legs and back  ROS Unable to obtain from patient  Past Medical History  Diagnosis Date  . Chronic kidney disease, stage III (moderate) 10/16/2012  . Bipolar I disorder, most recent episode (or current) unspecified 06/10/2012  . Coronary atherosclerosis of native coronary artery 06/10/2012  . Unspecified constipation 06/10/2012  . Nontraumatic hematoma of soft tissue 06/10/2012  . Other dysphagia 06/10/2012  . Debility, unspecified 06/10/2012  . Unspecified vitamin D deficiency 06/07/2010  . Contracture of joint 03/02/2009  . Unspecified hereditary and idiopathic peripheral neuropathy 08/13/2008  . Other acquired deformity of ankle and foot(736.79) 08/13/2008  . Closed fracture of unspecified part of lower end of humerus 08/13/2008`  . Lumbago 05/05/2006  . Simple schizophrenia, unspecified condition 12/17/2005  . Lipoma of unspecified site 06/04/2003  . Pure hypercholesterolemia 06/04/2003  . Depressive disorder, not elsewhere classified 06/04/2003  . Diaphragmatic hernia without mention of obstruction or gangrene 06/04/2003  . Diverticulosis of colon (without mention of hemorrhage) 06/04/2003  . Alzheimer's disease 03/26/2003  . Unspecified essential hypertension 03/26/2003  . Osteoarthrosis, unspecified whether generalized or localized,  unspecified site 03/26/2003  . Hypopotassemia 08/04/2002  . Osteoporosis, unspecified 08/20/2000  . Infantile cerebral palsy, unspecified 10/29/1979  . Scoliosis (and kyphoscoliosis), idiopathic 10/29/1979  . Functional quadriplegia    Past Surgical History  Procedure Laterality Date  . Abdominal hysterectomy  1960  . Fracture surgery  03/12/2007   Current Outpatient Prescriptions on File Prior to Visit  Medication Sig Dispense Refill  . acetaminophen (TYLENOL) 500 MG tablet Take 500 mg by mouth every 6 (six) hours as needed for pain. Take 1-2 tablets every 6 hours as needed for pain. One tablet for mild pain, two tablets for moderate pain.      Marland Kitchen aspirin 81 MG tablet Take 81 mg by mouth daily. Take 1 tablet daily for heart health.      . Cholecalciferol 2000 UNITS CAPS Take 1 capsule by mouth daily.      Marland Kitchen FLUoxetine (PROZAC) 20 MG tablet Take 10 mg by mouth daily.       Marland Kitchen HYDROcodone-acetaminophen (NORCO/VICODIN) 5-325 MG per tablet Take one tablet by mouth twice daily for pain; Take one tablet by mouth every 6 hours as needed for severe pain.  180 tablet  0  . lamoTRIgine (LAMICTAL) 150 MG tablet Take 150 mg by mouth daily.      Marland Kitchen LORazepam (ATIVAN) 0.5 MG tablet Take one tablet by mouth three times daily for anxiety  90 tablet  5  . losartan (COZAAR) 50 MG tablet Take 25 mg by mouth daily.       Marland Kitchen omeprazole (PRILOSEC) 20 MG capsule Take 20 mg by mouth daily.      . risperiDONE (RISPERDAL) 1 MG tablet Take 1 mg  by mouth daily. 1 tab in am and 1 and half tab in pm      . senna-docusate (SENNA S) 8.6-50 MG per tablet Take 1 tablet by mouth 2 (two) times daily.       . simvastatin (ZOCOR) 5 MG tablet Take 5 mg by mouth at bedtime. Take 1 tablet daily for cholesterol.       No current facility-administered medications on file prior to visit.    History reviewed. No pertinent family history.  History   Social History  . Marital Status: Single    Spouse Name: N/A    Number of  Children: N/A  . Years of Education: N/A   Occupational History  . Not on file.   Social History Main Topics  . Smoking status: Not on file  . Smokeless tobacco: Not on file  . Alcohol Use: Not on file  . Drug Use: Not on file  . Sexual Activity: Not on file   Other Topics Concern  . Not on file   Social History Narrative  . No narrative on file     Physical exam BP 144/76  Pulse 79  Temp(Src) 97.1 F (36.2 C)  Resp 16  Constitutional: She appears well-developed. No distress. In bed, has garbled speech   HENT:   Head: Normocephalic and atraumatic.   Mouth/Throat: Oropharynx is clear and moist.   Eyes: Conjunctivae are normal.   Neck: Normal range of motion. Neck supple. No tracheal deviation present.   Cardiovascular: Normal rate, regular rhythm and intact distal pulses.    Pulmonary/Chest: Effort normal and breath sounds normal. No respiratory distress.   Abdominal: Soft. Bowel sounds are normal. She exhibits no mass.  Musculoskeletal: She exhibits no edema. Contracture of LUE, right 2nd PIP and 3-5 th fingers contracted, b/l foot drop, unable to move LE  Lymphadenopathy: She has no cervical adenopathy.  Neurological: Calm and awake, able to answer simple questions   Skin: Skin is warm and dry. She is not diaphoretic. Pressure ulcer in her buttock  Labs reviewed: 02/11/13 b12 520, wbc 7.1, hb 12.1, hct 37.0, mcv 91.1, ca 9.4, na 139, k 4.2, bun 23, cr 0.82, glu 97, chol 159, tg 105, ldl 96, hdl 42 a1c 5.15.14 5.2 08/08/13 b12 349, wbc 7.4, hb 11.7, hct 38.2, plt 260, ca 9.1, na 138, k 4.2, bun 23, cr 0.7, glu 96, t.chol 165, tg 186, ldl 90, hdl 38  Assessment/plan  HTN- continue cozaar 25 mg daily with amlodipine 5 mg po daily, continue aspirin  bp better controlled  Chronic pain- in setting of her quadriplegia and muscle spasm. change hydrocodone-apap to 5-325 tid and continue prn norco for breakthough pain. Will also add baclofen 5 mg bid for muscle  spasm  Schizophrenia- continue lamictal, fluoxetine, risperidal and prn ativan. Monitor clinically. Calm this visit  GERD- stable on prilosec, monitor clinically  Quadriplegia- bed bound, continue total care- skin care, aspiration precautions, fall precautions, pain medications with aspirin and statin  Osteoporosis-  Continue ca-vit d supplement, fall precautions  Hyperlipidemia- continue zocor 5 mg daily for now  Constipation- continue current bowel regimen  Vitamin b12 def- continue b12 supplement     Blanchie Serve, MD  [facility] [phone] (Monday-Friday 8 am - 5 pm) [phone] (afterhours)

## 2014-01-06 ENCOUNTER — Other Ambulatory Visit: Payer: Self-pay | Admitting: *Deleted

## 2014-01-06 MED ORDER — HYDROCODONE-ACETAMINOPHEN 5-325 MG PO TABS: ORAL_TABLET | ORAL | Status: DC

## 2014-01-06 NOTE — Telephone Encounter (Signed)
[facility] 

## 2014-01-30 ENCOUNTER — Non-Acute Institutional Stay (SKILLED_NURSING_FACILITY): Payer: PRIVATE HEALTH INSURANCE | Admitting: Internal Medicine

## 2014-01-30 DIAGNOSIS — I10 Essential (primary) hypertension: Secondary | ICD-10-CM

## 2014-01-30 DIAGNOSIS — F339 Major depressive disorder, recurrent, unspecified: Secondary | ICD-10-CM

## 2014-01-30 DIAGNOSIS — L89309 Pressure ulcer of unspecified buttock, unspecified stage: Secondary | ICD-10-CM

## 2014-01-30 DIAGNOSIS — M159 Polyosteoarthritis, unspecified: Secondary | ICD-10-CM

## 2014-02-21 NOTE — Progress Notes (Signed)
Patient ID: Kristen Owens, female   DOB: 19-Apr-1930, 80 y.o.   MRN: [id number]    [facility]  Chief complaint- medical management of chronic illness  Allergies reviewed  Code status- DNR  HPI 80 y/o female is a seen for routine visit. She is at her functional baseline and is under total care. No falls reported. Unable to obtain ROS from her but as per staff, she is able to make her needs known. Weight has been stable. She is not in any distress. She has stage 3 pressure ulcer in her left buttock and is getting wound care. She is calm today  ROS Unable to obtain  Medication reviewed. See [facility]  Past Medical History  Diagnosis Date  . Chronic kidney disease, stage III (moderate) 10/16/2012  . Bipolar I disorder, most recent episode (or current) unspecified 06/10/2012  . Coronary atherosclerosis of native coronary artery 06/10/2012  . Unspecified constipation 06/10/2012  . Nontraumatic hematoma of soft tissue 06/10/2012  . Other dysphagia 06/10/2012  . Debility, unspecified 06/10/2012  . Unspecified vitamin D deficiency 06/07/2010  . Contracture of joint 03/02/2009  . Unspecified hereditary and idiopathic peripheral neuropathy 08/13/2008  . Other acquired deformity of ankle and foot(736.79) 08/13/2008  . Closed fracture of unspecified part of lower end of humerus 08/13/2008`  . Lumbago 05/05/2006  . Simple schizophrenia, unspecified condition 12/17/2005  . Lipoma of unspecified site 06/04/2003  . Pure hypercholesterolemia 06/04/2003  . Depressive disorder, not elsewhere classified 06/04/2003  . Diaphragmatic hernia without mention of obstruction or gangrene 06/04/2003  . Diverticulosis of colon (without mention of hemorrhage) 06/04/2003  . Alzheimer's disease 03/26/2003  . Unspecified essential hypertension 03/26/2003  . Osteoarthrosis, unspecified whether generalized or localized, unspecified site 03/26/2003  . Hypopotassemia 08/04/2002  . Osteoporosis,  unspecified 08/20/2000  . Infantile cerebral palsy, unspecified 10/29/1979  . Scoliosis (and kyphoscoliosis), idiopathic 10/29/1979  . Functional quadriplegia    Past Surgical History  Procedure Laterality Date  . Abdominal hysterectomy  1960  . Fracture surgery  03/12/2007    Physical exam BP 120/78  Pulse 60  Temp(Src) 97.4 F (36.3 C)  Resp 19  Constitutional: She appears well-developed. No distress. In bed, has garbled speech   HENT:   Head: Normocephalic and atraumatic.   Mouth/Throat: Oropharynx is clear and moist.   Eyes: Conjunctivae are normal.   Neck: Normal range of motion. Neck supple. No tracheal deviation present.   Cardiovascular: Normal rate, regular rhythm and intact distal pulses.    Pulmonary/Chest: Effort normal and breath sounds normal. No respiratory distress.   Abdominal: Soft. Bowel sounds are normal. She exhibits no mass.  Musculoskeletal: She exhibits no edema. Contracture of LUE, right 2nd PIP and 3-5 th fingers contracted, b/l foot drop, unable to move LE  Lymphadenopathy:    She has no cervical adenopathy.  Neurological:  Calm and awake, able to answer simple questions   Skin: Skin is warm and dry. She is not diaphoretic. Pressure ulcer in her buttock  Labs reviewed: 02/11/13 b12 520, wbc 7.1, hb 12.1, hct 37.0, mcv 91.1, ca 9.4, na 139, k 4.2, bun 23, cr 0.82, glu 97, chol 159, tg 105, ldl 96, hdl 42 a1c 5.15.14 5.2 08/08/13 b12 349, wbc 7.4, hb 11.7, hct 38.2, plt 260, ca 9.1, na 138, k 4.2, bun 23, cr 0.7, glu 96, t.chol 165, tg 186, ldl 90, hdl 38  Assessment/plan  OA- continue norco bid with prn norco for pain. Continue baclofen and senna  s. Monitor bowel movement  Depressive disorder- continue ativan 0.5 mg tid and prozac. Monitor behavior  Pressure ulcer buttock- has gel overlay. Continue vitamin c and zinc supplement. Continue silver alginate dressing  HTN- continue cozaar 25 mg daily with amlodipine 5 mg po daily, continue aspirin

## 2014-02-22 DIAGNOSIS — M159 Polyosteoarthritis, unspecified: Secondary | ICD-10-CM | POA: Insufficient documentation

## 2014-02-22 DIAGNOSIS — L89309 Pressure ulcer of unspecified buttock, unspecified stage: Secondary | ICD-10-CM | POA: Insufficient documentation

## 2014-02-22 DIAGNOSIS — I10 Essential (primary) hypertension: Secondary | ICD-10-CM | POA: Insufficient documentation

## 2014-02-22 DIAGNOSIS — F339 Major depressive disorder, recurrent, unspecified: Secondary | ICD-10-CM | POA: Insufficient documentation

## 2014-02-27 ENCOUNTER — Other Ambulatory Visit: Payer: Self-pay | Admitting: *Deleted

## 2014-02-27 MED ORDER — HYDROCODONE-ACETAMINOPHEN 5-325 MG PO TABS: ORAL_TABLET | ORAL | Status: DC

## 2014-02-27 NOTE — Telephone Encounter (Signed)
rx faxed to [facility] @ [phone]

## 2014-02-27 NOTE — Telephone Encounter (Signed)
rx printed/faxed to [facility] @ [phone] 

## 2014-03-16 ENCOUNTER — Non-Acute Institutional Stay (SKILLED_NURSING_FACILITY): Payer: PRIVATE HEALTH INSURANCE | Admitting: Internal Medicine

## 2014-03-16 DIAGNOSIS — L8993 Pressure ulcer of unspecified site, stage 3: Secondary | ICD-10-CM

## 2014-03-16 DIAGNOSIS — L8990 Pressure ulcer of unspecified site, unspecified stage: Secondary | ICD-10-CM

## 2014-03-16 DIAGNOSIS — I739 Peripheral vascular disease, unspecified: Secondary | ICD-10-CM

## 2014-03-16 DIAGNOSIS — R532 Functional quadriplegia: Secondary | ICD-10-CM

## 2014-03-26 ENCOUNTER — Other Ambulatory Visit: Payer: Self-pay | Admitting: *Deleted

## 2014-03-26 MED ORDER — LORAZEPAM 0.5 MG PO TABS: ORAL_TABLET | ORAL | Status: DC

## 2014-03-26 NOTE — Telephone Encounter (Signed)
[facility] 

## 2014-04-16 ENCOUNTER — Encounter: Payer: Self-pay | Admitting: Internal Medicine

## 2014-04-16 ENCOUNTER — Encounter: Payer: Self-pay | Admitting: *Deleted

## 2014-04-16 ENCOUNTER — Non-Acute Institutional Stay (SKILLED_NURSING_FACILITY): Payer: PRIVATE HEALTH INSURANCE | Admitting: Internal Medicine

## 2014-04-16 DIAGNOSIS — F339 Major depressive disorder, recurrent, unspecified: Secondary | ICD-10-CM

## 2014-04-16 DIAGNOSIS — F0280 Dementia in other diseases classified elsewhere without behavioral disturbance: Secondary | ICD-10-CM

## 2014-04-16 DIAGNOSIS — G309 Alzheimer's disease, unspecified: Secondary | ICD-10-CM

## 2014-04-16 DIAGNOSIS — F2089 Other schizophrenia: Secondary | ICD-10-CM

## 2014-04-22 NOTE — Progress Notes (Signed)
This encounter was created in error - please disregard.

## 2014-05-04 ENCOUNTER — Non-Acute Institutional Stay (SKILLED_NURSING_FACILITY): Payer: PRIVATE HEALTH INSURANCE | Admitting: Internal Medicine

## 2014-05-04 DIAGNOSIS — F339 Major depressive disorder, recurrent, unspecified: Secondary | ICD-10-CM

## 2014-05-04 DIAGNOSIS — M159 Polyosteoarthritis, unspecified: Secondary | ICD-10-CM

## 2014-05-18 ENCOUNTER — Other Ambulatory Visit: Payer: Self-pay | Admitting: *Deleted

## 2014-05-18 MED ORDER — HYDROCODONE-ACETAMINOPHEN 5-325 MG PO TABS: ORAL_TABLET | ORAL | Status: DC

## 2014-05-18 NOTE — Telephone Encounter (Signed)
[facility] 

## 2014-06-07 DIAGNOSIS — L8993 Pressure ulcer of unspecified site, stage 3: Secondary | ICD-10-CM | POA: Insufficient documentation

## 2014-06-07 DIAGNOSIS — I739 Peripheral vascular disease, unspecified: Secondary | ICD-10-CM | POA: Insufficient documentation

## 2014-06-07 NOTE — Progress Notes (Signed)
Patient ID: Kristen Owens, female   DOB: 1930-02-24, 80 y.o.   MRN: [id number]    [facility]  Chief complaint- medical management of chronic illness  Allergies reviewed  Code status- DNR  HPI 80 y/o female is a seen for routine visit. She is at her functional baseline with functional quadriplegia and is under total care. Unable to obtain ROS from her but as per staff, she is able to make her needs known. Weight has been stable. She is not in any distress. She has depression and alzhimer's disease  ROS Unable to obtain  Medication reviewed. See [facility]  Physical exam BP 116/79  Pulse 81  Temp(Src) 96.7 F (35.9 C)  Resp 17  SpO2 97%  Constitutional: She appears well-developed. No distress.In bed, has garbled speech   HENT:   Head: Normocephalic and atraumatic.   Mouth/Throat: Oropharynx is clear and moist.   Eyes: Conjunctivae are normal.   Neck: Normal range of motion. Neck supple. No tracheal deviation present.   Cardiovascular: Normal rate, regular rhythm and intact distal pulses.    Pulmonary/Chest: Effort normal and breath sounds normal. No respiratory distress.   Abdominal: Soft. Bowel sounds are normal. She exhibits no mass.  Musculoskeletal: She exhibits no edema. Contracture of LUE, right 2nd PIP and 3-5 th fingers contracted, b/l foot drop, unable to move LE  Lymphadenopathy:    She has no cervical adenopathy.  Neurological:  Calm and awake, able to answer simple questions   Skin: Skin is warm and dry. She is not diaphoretic. Pressure ulcer in her buttock  Labs reviewed: 02/11/13 b12 520, wbc 7.1, hb 12.1, hct 37.0, mcv 91.1, ca 9.4, na 139, k 4.2, bun 23, cr 0.82, glu 97, chol 159, tg 105, ldl 96, hdl 42 a1c 5.15.14 5.2 08/08/13 b12 349, wbc 7.4, hb 11.7, hct 38.2, plt 260, ca 9.1, na 138, k 4.2, bun 23, cr 0.7, glu 96, t.chol 165, tg 186, ldl 90, hdl 38 02/05/14 a1c 5.7  Assessment/plan  Schizophrenia- continue lamictal,  risperidal and prn  ativan. Monitor clinically. Calm this visit  alzhimer's disease- decline anticipated. Comfort care is goal of care for her. Monitor clinically, behavior calm  Depression- continue prozac and ativan, monitor clinically

## 2014-06-07 NOTE — Progress Notes (Signed)
Patient ID: Kristen Owens, female   DOB: 12-Jul-1930, 80 y.o.   MRN: [id number]    [facility]  Chief complaint- medical management of chronic illness  Allergies reviewed  Code status- DNR  HPI 80 y/o female is a seen for routine visit. She is at her functional baseline and is under total care. She has functional quadriplegia. She gets pureed diet. No falls reported. ongoing pressure ulcer in her bottom Unable to obtain ROS from her but as per staff, she is able to make her needs known. Weight has been stable. She is not in any distress  ROS Unable to obtain  Medication reviewed. See [facility]  Past Medical History  Diagnosis Date  . Chronic kidney disease, stage III (moderate) 10/16/2012  . Bipolar I disorder, most recent episode (or current) unspecified 06/10/2012  . Coronary atherosclerosis of native coronary artery 06/10/2012  . Unspecified constipation 06/10/2012  . Nontraumatic hematoma of soft tissue 06/10/2012  . Other dysphagia 06/10/2012  . Debility, unspecified 06/10/2012  . Unspecified vitamin D deficiency 06/07/2010  . Contracture of joint 03/02/2009  . Unspecified hereditary and idiopathic peripheral neuropathy 08/13/2008  . Other acquired deformity of ankle and foot(736.79) 08/13/2008  . Closed fracture of unspecified part of lower end of humerus 08/13/2008`  . Lumbago 05/05/2006  . Simple schizophrenia, unspecified condition 12/17/2005  . Lipoma of unspecified site 06/04/2003  . Pure hypercholesterolemia 06/04/2003  . Depressive disorder, not elsewhere classified 06/04/2003  . Diaphragmatic hernia without mention of obstruction or gangrene 06/04/2003  . Diverticulosis of colon (without mention of hemorrhage) 06/04/2003  . Alzheimer's disease 03/26/2003  . Unspecified essential hypertension 03/26/2003  . Osteoarthrosis, unspecified whether generalized or localized, unspecified site 03/26/2003  . Hypopotassemia 08/04/2002  . Osteoporosis,  unspecified 08/20/2000  . Infantile cerebral palsy, unspecified 10/29/1979  . Scoliosis (and kyphoscoliosis), idiopathic 10/29/1979  . Functional quadriplegia      Physical exam BP 129/74  Pulse 88  Temp(Src) 97.6 F (36.4 C)  Resp 16  Wt 112 lb 3.2 oz (50.894 kg)  SpO2 98%  Constitutional: She appears well-developed. No distress.  In bed, has garbled speech   HENT:   Head: Normocephalic and atraumatic.   Mouth/Throat: Oropharynx is clear and moist.   Eyes: Conjunctivae are normal.   Neck: Normal range of motion. Neck supple. No tracheal deviation present.   Cardiovascular: Normal rate, regular rhythm and intact distal pulses.    Pulmonary/Chest: Effort normal and breath sounds normal. No respiratory distress.   Abdominal: Soft. Bowel sounds are normal. She exhibits no mass.  Musculoskeletal: She exhibits no edema.  Contracture of LUE, right 2nd PIP and 3-5 th fingers contracted, b/l foot drop, unable to move LE  Lymphadenopathy:    She has no cervical adenopathy.  Neurological:  Calm and awake, able to answer simple questions   Skin: Skin is warm and dry. She is not diaphoretic.   Labs reviewed: 02/11/13 b12 520, wbc 7.1, hb 12.1, hct 37.0, mcv 91.1, ca 9.4, na 139, k 4.2, bun 23, cr 0.82, glu 97, chol 159, tg 105, ldl 96, hdl 42 a1c 5.15.14 5.2 08/08/13 b12 349, wbc 7.4, hb 11.7, hct 38.2, plt 260, ca 9.1, na 138, k 4.2, bun 23, cr 0.7, glu 96, t.chol 165, tg 186, ldl 90, hdl 38 02/05/14 b12 1500, wbc 6.2, hb 11.8, hct 38.3, plt 258, ca 9.4, alb 3.6, ast 15, alp 95, alt 15, na 140, k 4.1, cl 104, co2 26, bun 19, cr  0.8, glu 82, t.chol 171, tg 172, ldl 92, hdl 45, a1c 5.7  Assessment/plan  Functional quadriplegia Under totla care. Pressure ulcer prophylaxis. Continue norco current regimen for pain.  Pressure ulcer stage 3 Healed, continue gel overlay and prophylaxis  PVD No new wounds/ ulcer. Air mattress, monitor for skin lision, frequent repositioning  GERD stable  on prilosec, monitor clinically

## 2014-06-07 NOTE — Progress Notes (Signed)
This encounter was created in error - please disregard.

## 2014-06-15 ENCOUNTER — Non-Acute Institutional Stay (SKILLED_NURSING_FACILITY): Payer: PRIVATE HEALTH INSURANCE | Admitting: Internal Medicine

## 2014-06-15 ENCOUNTER — Encounter: Payer: Self-pay | Admitting: Internal Medicine

## 2014-06-15 DIAGNOSIS — M810 Age-related osteoporosis without current pathological fracture: Secondary | ICD-10-CM

## 2014-06-15 DIAGNOSIS — G309 Alzheimer's disease, unspecified: Secondary | ICD-10-CM

## 2014-06-15 DIAGNOSIS — F0280 Dementia in other diseases classified elsewhere without behavioral disturbance: Secondary | ICD-10-CM

## 2014-06-15 DIAGNOSIS — R532 Functional quadriplegia: Secondary | ICD-10-CM

## 2014-06-15 DIAGNOSIS — E785 Hyperlipidemia, unspecified: Secondary | ICD-10-CM

## 2014-07-15 DIAGNOSIS — F2089 Other schizophrenia: Secondary | ICD-10-CM | POA: Insufficient documentation

## 2014-07-15 DIAGNOSIS — F0280 Dementia in other diseases classified elsewhere without behavioral disturbance: Secondary | ICD-10-CM | POA: Insufficient documentation

## 2014-07-15 DIAGNOSIS — G309 Alzheimer's disease, unspecified: Secondary | ICD-10-CM

## 2014-07-15 NOTE — Progress Notes (Signed)
Patient ID: RHIANNAN KIEVIT, female   DOB: 1930-05-01, 80 y.o.   MRN: [id number]    [facility]  Chief complaint- medical management of chronic illness  Allergies reviewed  Code status- DNR  HPI 80 y/o female patient with functional quadriplegia under total care, with hx of depression and dementia, HTN, PVD is seen for routine visit. is a seen for routine visit. She is at her functional baseline   Unable to obtain ROS from her but as per staff, she is able to make her needs known. Weight has been stable. She is not in any distress  ROS Unable to obtain  Medication reviewed. See [facility]  Physical exam BP 133/72  Pulse 84  Temp(Src) 96 F (35.6 C)  Resp 18  Wt 108 lb (48.988 kg)  Constitutional: She appears well-developed. No distress  HENT:   Head: Normocephalic and atraumatic.   Mouth/Throat: Oropharynx is clear and moist.   Cardiovascular: Normal rate, regular rhythm and intact distal pulses.    Pulmonary/Chest: Effort normal and breath sounds normal. No respiratory distress.   Abdominal: Soft. Bowel sounds are normal. She exhibits no mass.  Musculoskeletal: She exhibits no edema. Contracture of LUE, right 2nd PIP and 3-5 th fingers contracted, b/l foot drop, unable to move LE . Hoyer transfer Lymphadenopathy:    She has no cervical adenopathy.  Skin: Skin is warm and dry. She is not diaphoretic.   Labs reviewed: 02/11/13 b12 520, wbc 7.1, hb 12.1, hct 37.0, mcv 91.1, ca 9.4, na 139, k 4.2, bun 23, cr 0.82, glu 97, chol 159, tg 105, ldl 96, hdl 42 a1c 5.15.14 5.2 08/08/13 b12 349, wbc 7.4, hb 11.7, hct 38.2, plt 260, ca 9.1, na 138, k 4.2, bun 23, cr 0.7, glu 96, t.chol 165, tg 186, ldl 90, hdl 38 02/05/14 b12 1500, wbc 6.2, hb 11.8, hct 38.3, plt 258, ca 9.4, ast 15, alt 95, na 140, k 4.1, cl 104, co2 26, bun 19, cr 0.8, glu 82, t.chol 171, tg 172, ldl 92, hdl 45, a1c 5.7  Assessment/plan  OA Continue her current regimen of nroco and tylenol with  baclofen for now. Monitor clinically  Depression Continue prozac and ativan, has failed GDR in past. Monitor clinically

## 2014-07-16 ENCOUNTER — Encounter: Payer: Self-pay | Admitting: Internal Medicine

## 2014-07-16 ENCOUNTER — Non-Acute Institutional Stay (SKILLED_NURSING_FACILITY): Payer: PRIVATE HEALTH INSURANCE | Admitting: Internal Medicine

## 2014-07-16 DIAGNOSIS — E785 Hyperlipidemia, unspecified: Secondary | ICD-10-CM | POA: Insufficient documentation

## 2014-07-16 DIAGNOSIS — F2089 Other schizophrenia: Secondary | ICD-10-CM

## 2014-07-16 DIAGNOSIS — I129 Hypertensive chronic kidney disease with stage 1 through stage 4 chronic kidney disease, or unspecified chronic kidney disease: Secondary | ICD-10-CM

## 2014-07-16 DIAGNOSIS — D638 Anemia in other chronic diseases classified elsewhere: Secondary | ICD-10-CM

## 2014-07-16 NOTE — Progress Notes (Signed)
This encounter was created in error - please disregard.

## 2014-07-16 NOTE — Progress Notes (Signed)
Patient ID: Kristen Owens, female   DOB: 1930-02-07, 80 y.o.   MRN: [id number]    [facility]  Chief complaint- medical management of chronic illness  Allergies reviewed  Code status- DNR  HPI 80 y/o female is a seen for routine visit. She is at her functional baseline and is under total care.  Unable to obtain ROS from her but as per staff, she is able to make her needs known.   ROS Unable to obtain  Medication reviewed. See [facility]  Physical exam BP 146/68  Pulse 68  Temp(Src) 98 F (36.7 C)  Resp 18  Wt 105 lb 9.6 oz (47.9 kg)  SpO2 97%  Constitutional: She appears well-developed. No distress.  Neck: Normal range of motion. Neck supple. No tracheal deviation present.   Cardiovascular: Normal rate, regular rhythm and intact distal pulses.    Pulmonary/Chest: Effort normal and breath sounds normal. No respiratory distress.   Abdominal: Soft. Bowel sounds are normal. She exhibits no mass.  Musculoskeletal: She exhibits no edema. Contracture of LUE, right 2nd PIP and 3-5 th fingers contracted, b/l foot drop, unable to move LE  Lymphadenopathy:She has no cervical adenopathy.  Neurological: unable to assess Skin: Skin is warm and dry. She is not diaphoretic. Pressure ulcer in her buttock  Labs reviewed: 02/11/13 b12 520, wbc 7.1, hb 12.1, hct 37.0, mcv 91.1, ca 9.4, na 139, k 4.2, bun 23, cr 0.82, glu 97, chol 159, tg 105, ldl 96, hdl 42 a1c 5.15.14 5.2 08/08/13 b12 349, wbc 7.4, hb 11.7, hct 38.2, plt 260, ca 9.1, na 138, k 4.2, bun 23, cr 0.7, glu 96, t.chol 165, tg 186, ldl 90, hdl 38 02/05/14 b12 1500, wbc 6.2, hb 11.8, hct 38.3, plt 258, ca 9.4, ast 15, alt 95, na 140, k 4.1, cl 104, co2 26, bun 19, cr 0.8, glu 82, t.chol 171, tg 172, ldl 92, hdl 45, a1c 5.7   Assessment/plan  Quadriplegia continue baclofen for spasm and hydrocodone-apap 5-325 bid and prn norco oxycodone for pain prn. Continue asa and statin. bp under control  Osteoporosis Continue  ca-vit d supplement, fall precautions  Hyperlipidemia continue zocor 5 mg daily for now  Dementia Decline anticipated, off meds, monitor for assistance with ADLS, skin care, fall precuations ad po intake

## 2014-07-16 NOTE — Progress Notes (Signed)
Patient ID: Kristen Owens, female   DOB: 02-13-1930, 80 y.o.   MRN: [id number]   [facility]  Chief complaint- medical management of chronic illness  Allergies reviewed  Code status- DNR  HPI 80 y/o female is a seen for routine visit. She is at her functional baseline and is under total care. She gets pureed diet. No falls reported. No new nursing concerns. Stable with her mood. Unable to obtain ROS from her but as per staff, she is able to make her needs known. Has had some weight loss  ROS Unable to obtain  Medication reviewed. See [facility]  Physical exam BP 129/60  Pulse 60  Temp(Src) 97.9 F (36.6 C)  Resp 18  Wt 103 lb 8 oz (46.947 kg)  SpO2 97%  Constitutional: She appears well-developed. No distress.  HENT:   Head: Normocephalic and atraumatic.   Mouth/Throat: Oropharynx is clear and moist.   Eyes: Conjunctivae are normal.   Neck: Normal range of motion. Neck supple. No tracheal deviation present.   Cardiovascular: Normal rate, regular rhythm and intact distal pulses.    Pulmonary/Chest: Effort normal and breath sounds normal. No respiratory distress.   Abdominal: Soft. Bowel sounds are normal. She exhibits no mass.  Musculoskeletal: She exhibits no edema. Contracture of LUE, right 2nd PIP and 3-5 th fingers contracted, b/l foot drop, unable to move LE  Lymphadenopathy:She has no cervical adenopathy.  Neurological: unable to assess Skin: Skin is warm and dry. She is not diaphoretic. Pressure ulcer in her buttock  Labs reviewed: 02/11/13 b12 520, wbc 7.1, hb 12.1, hct 37.0, mcv 91.1, ca 9.4, na 139, k 4.2, bun 23, cr 0.82, glu 97, chol 159, tg 105, ldl 96, hdl 42 a1c 5.15.14 5.2 08/08/13 b12 349, wbc 7.4, hb 11.7, hct 38.2, plt 260, ca 9.1, na 138, k 4.2, bun 23, cr 0.7, glu 96, t.chol 165, tg 186, ldl 90, hdl 38 02/05/14 b12 1500, wbc 6.2, hb 11.8, hct 38.3, plt 258, ca 9.4, ast 15, alt 95, na 140, k 4.1, cl 104, co2 26, bun 19, cr 0.8, glu 82, t.chol  171, tg 172, ldl 92, hdl 45, a1c 5.7  Assessment/plan  Anemia of chronic disease Hb/hct stable, monitor clinically. On ppi  Schizophrenia continue lamictal, risperidal and prn ativan. Monitor clinically. Calm this visit  Hypertensive renal disease continue cozaar 25 mg daily and amlodipine 5 mg po daily with aspirin. bp well controlled today

## 2014-08-08 LAB — BASIC METABOLIC PANEL
BUN: 29 mg/dL — AB (ref 4–21)
CREATININE: 0.9 mg/dL (ref 0.5–1.1)
Glucose: 86 mg/dL
Potassium: 4.1 mmol/L (ref 3.4–5.3)
SODIUM: 144 mmol/L (ref 137–147)

## 2014-08-08 LAB — CBC AND DIFFERENTIAL
HEMATOCRIT: 32 % — AB (ref 36–46)
Hemoglobin: 10 g/dL — AB (ref 12.0–16.0)
PLATELETS: 218 10*3/uL (ref 150–399)
WBC: 5.8 10^3/mL

## 2014-08-08 LAB — LIPID PANEL
Cholesterol: 143 mg/dL (ref 0–200)
HDL: 42 mg/dL (ref 35–70)
LDL CALC: 76 mg/dL
TRIGLYCERIDES: 125 mg/dL (ref 40–160)

## 2014-08-08 LAB — HEMOGLOBIN A1C: Hgb A1c MFr Bld: 5.2 % (ref 4.0–6.0)

## 2014-08-17 ENCOUNTER — Non-Acute Institutional Stay (SKILLED_NURSING_FACILITY): Payer: PRIVATE HEALTH INSURANCE | Admitting: Internal Medicine

## 2014-08-17 DIAGNOSIS — L219 Seborrheic dermatitis, unspecified: Secondary | ICD-10-CM

## 2014-08-17 DIAGNOSIS — M159 Polyosteoarthritis, unspecified: Secondary | ICD-10-CM

## 2014-08-17 DIAGNOSIS — M62838 Other muscle spasm: Secondary | ICD-10-CM

## 2014-08-17 DIAGNOSIS — F331 Major depressive disorder, recurrent, moderate: Secondary | ICD-10-CM

## 2014-09-07 ENCOUNTER — Other Ambulatory Visit: Payer: Self-pay | Admitting: *Deleted

## 2014-09-07 MED ORDER — HYDROCODONE-ACETAMINOPHEN 5-325 MG PO TABS: ORAL_TABLET | ORAL | Status: DC

## 2014-09-07 NOTE — Telephone Encounter (Signed)
[facility] 

## 2014-09-09 DIAGNOSIS — M62838 Other muscle spasm: Secondary | ICD-10-CM | POA: Insufficient documentation

## 2014-09-09 DIAGNOSIS — M159 Polyosteoarthritis, unspecified: Secondary | ICD-10-CM | POA: Insufficient documentation

## 2014-09-09 DIAGNOSIS — F331 Major depressive disorder, recurrent, moderate: Secondary | ICD-10-CM | POA: Insufficient documentation

## 2014-09-09 DIAGNOSIS — L219 Seborrheic dermatitis, unspecified: Secondary | ICD-10-CM | POA: Insufficient documentation

## 2014-09-09 NOTE — Progress Notes (Signed)
Patient ID: Kristen Owens, female   DOB: December 04, 1929, 80 y.o.   MRN: [id number]    [facility]  Chief complaint- medical management of chronic illness  Allergies reviewed  Code status- DNR  HPI 80 y/o female is a seen for routine visit. She is at her functional baseline and is under total care. She has functional quadriplegia and is resting in her bed. Her mood remains stable. No new skin concern.  Weight has been stable. No new concern from staff  ROS Unable to obtain  Medication reviewed. See [facility]  Physical exam BP 129/78  Pulse 72  Temp(Src) 98.2 F (36.8 C)  Resp 16  Wt 104 lb 8 oz (47.401 kg)  SpO2 97%  Constitutional: She appears well-developed. No distress.  HENT:   Head: Normocephalic and atraumatic.   Mouth/Throat: Oropharynx is clear and moist.   Eyes: Conjunctivae are normal.   Neck: Normal range of motion. Neck supple. No tracheal deviation present.   Cardiovascular: Normal rate, regular rhythm and intact distal pulses.    Pulmonary/Chest: Effort normal and breath sounds normal. No respiratory distress.   Abdominal: Soft. Bowel sounds are normal. She exhibits no mass.  Musculoskeletal: She exhibits no edema. Contracture of LUE, right 2nd PIP and 3-5 th fingers contracted, b/l foot drop, unable to move LE  Lymphadenopathy:She has no cervical adenopathy.  Neurological: unable to assess Skin: Skin is warm and dry. She is not diaphoretic. Pressure ulcer in her buttock  Labs reviewed: 02/11/13 b12 520, wbc 7.1, hb 12.1, hct 37.0, mcv 91.1, ca 9.4, na 139, k 4.2, bun 23, cr 0.82, glu 97, chol 159, tg 105, ldl 96, hdl 42 a1c 5.15.14 5.2 08/08/13 b12 349, wbc 7.4, hb 11.7, hct 38.2, plt 260, ca 9.1, na 138, k 4.2, bun 23, cr 0.7, glu 96, t.chol 165, tg 186, ldl 90, hdl 38 02/05/14 b12 1500, wbc 6.2, hb 11.8, hct 38.3, plt 258, ca 9.4, ast 15, alt 95, na 140, k 4.1, cl 104, co2 26, bun 19, cr 0.8, glu 82, t.chol 171, tg 172, ldl 92, hdl 45, a1c  5.7   Assessment/plan  Depression Stable, continue prozac and ativan, failed GDR of ativan in past.   OA Continue her current regimen of norco and tylenol  Muscle spasm With her quadriplegia, continue baclofen for now  Seborrheic dermatitis Continue her des owen topical cream, improvement noted

## 2014-09-14 ENCOUNTER — Non-Acute Institutional Stay (SKILLED_NURSING_FACILITY): Payer: PRIVATE HEALTH INSURANCE | Admitting: Internal Medicine

## 2014-09-14 ENCOUNTER — Encounter: Payer: Self-pay | Admitting: Internal Medicine

## 2014-09-14 DIAGNOSIS — F0280 Dementia in other diseases classified elsewhere without behavioral disturbance: Secondary | ICD-10-CM

## 2014-09-14 DIAGNOSIS — I10 Essential (primary) hypertension: Secondary | ICD-10-CM

## 2014-09-14 DIAGNOSIS — M810 Age-related osteoporosis without current pathological fracture: Secondary | ICD-10-CM

## 2014-09-14 DIAGNOSIS — G309 Alzheimer's disease, unspecified: Secondary | ICD-10-CM

## 2014-09-14 DIAGNOSIS — E785 Hyperlipidemia, unspecified: Secondary | ICD-10-CM

## 2014-09-14 DIAGNOSIS — F3177 Bipolar disorder, in partial remission, most recent episode mixed: Secondary | ICD-10-CM

## 2014-09-14 DIAGNOSIS — R532 Functional quadriplegia: Secondary | ICD-10-CM

## 2014-09-14 DIAGNOSIS — K219 Gastro-esophageal reflux disease without esophagitis: Secondary | ICD-10-CM

## 2014-09-14 NOTE — Progress Notes (Signed)
Patient ID: Kristen Owens, female   DOB: 1930/02/28, 80 y.o.   MRN: [id number]    [facility]  Chief complaint- annual exam  Allergies reviewed  Code status- DNR  HPI 80 y/o female is a seen for annual exam. She is at her functional baseline and is under total care. She has functional quadriplegia and is resting in her bed. Her mood remains stable. No new skin concern.  Weight has been stable. No new concern from staff. She has hx of dementia, depression, osteoporosis, OA, HTN, PVD among others.  ROS Unable to obtain. No new skin concern. No falls. No acute behavioral concerns  Past Medical History  Diagnosis Date  . Chronic kidney disease, stage III (moderate) 10/16/2012  . Bipolar I disorder, most recent episode (or current) unspecified 06/10/2012  . Coronary atherosclerosis of native coronary artery 06/10/2012  . Unspecified constipation 06/10/2012  . Nontraumatic hematoma of soft tissue 06/10/2012  . Other dysphagia 06/10/2012  . Debility, unspecified 06/10/2012  . Unspecified vitamin D deficiency 06/07/2010  . Contracture of joint 03/02/2009  . Unspecified hereditary and idiopathic peripheral neuropathy 08/13/2008  . Other acquired deformity of ankle and foot(736.79) 08/13/2008  . Closed fracture of unspecified part of lower end of humerus 08/13/2008`  . Lumbago 05/05/2006  . Simple schizophrenia, unspecified condition 12/17/2005  . Lipoma of unspecified site 06/04/2003  . Pure hypercholesterolemia 06/04/2003  . Depressive disorder, not elsewhere classified 06/04/2003  . Diaphragmatic hernia without mention of obstruction or gangrene 06/04/2003  . Diverticulosis of colon (without mention of hemorrhage) 06/04/2003  . Alzheimer's disease 03/26/2003  . Unspecified essential hypertension 03/26/2003  . Osteoarthrosis, unspecified whether generalized or localized, unspecified site 03/26/2003  . Hypopotassemia 08/04/2002  . Osteoporosis, unspecified  08/20/2000  . Infantile cerebral palsy, unspecified 10/29/1979  . Scoliosis (and kyphoscoliosis), idiopathic 10/29/1979  . Functional quadriplegia    Past Surgical History  Procedure Laterality Date  . Abdominal hysterectomy  1960  . Fracture surgery  03/12/2007    Medication reviewed. See [facility]  Physical exam BP 130/70  Pulse 70  Temp(Src) 97 F (36.1 C)  Resp 18  Ht 4\' 8"  (1.422 m)  Wt 103 lb 12.8 oz (47.083 kg)  BMI 23.28 kg/m2  SpO2 97%   Constitutional: She appears well-developed. No distress.  HENT:   Head: Normocephalic and atraumatic.   Mouth/Throat: Oropharynx is clear and moist.   Eyes: Conjunctivae are normal.   Neck: Normal range of motion. Neck supple. No tracheal deviation present.   Cardiovascular: Normal rate, regular rhythm and intact distal pulses.    Pulmonary/Chest: Effort normal and breath sounds normal. No respiratory distress.   Abdominal: Soft. Bowel sounds are normal. She exhibits no mass.  Musculoskeletal: She exhibits no edema. Contracture of LUE, right 2nd PIP and 3-5 th fingers contracted, b/l foot drop, unable to move LE , hoyer transfer Lymphadenopathy:She has no cervical adenopathy.  Neurological: unable to assess Skin: Skin is warm and dry. She is not diaphoretic.   Labs reviewed: 02/11/13 b12 520, wbc 7.1, hb 12.1, hct 37.0, mcv 91.1, ca 9.4, na 139, k 4.2, bun 23, cr 0.82, glu 97, chol 159, tg 105, ldl 96, hdl 42 a1c 5.15.14 5.2 08/08/13 b12 349, wbc 7.4, hb 11.7, hct 38.2, plt 260, ca 9.1, na 138, k 4.2, bun 23, cr 0.7, glu 96, t.chol 165, tg 186, ldl 90, hdl 38 02/05/14 b12 1500, wbc 6.2, hb 11.8, hct 38.3, plt 258, ca 9.4, ast 15, alt 95,  na 140, k 4.1, cl 104, co2 26, bun 19, cr 0.8, glu 82, t.chol 171, tg 172, ldl 92, hdl 45, a1c 5.7 08/10/14 wbc 5.8, hb 10, hct 32.5, mcv 100.3, plt 218   Assessment/plan  alzhimer's disease Stable, decline anticipated with her hx of cerebral palsy and functional quadriplegia. Continue total care with  skin care. Continue puree diet with aspiration precautions.   Bipolar disorder Stable, continue lamictal 150 mg daily, prozac 10 mg daily, risperdal 1mg  daily in am and 1.5 mg in pm and her ativan qid  Hypertension bp controlled, continue losartan 25 mg daily and norvasc 5 mg daily  Hyperlipidemia Continue zocor current regimen 5 mg daily  Functional quadriplegia Stable, continue norco tid with prn norco and tylenol along with her baclofen  Osteoporosis Stable, continue osacal and vit  D supplement  gerd Continue her prilosec for now

## 2014-09-28 ENCOUNTER — Other Ambulatory Visit: Payer: Self-pay | Admitting: *Deleted

## 2014-09-28 MED ORDER — LORAZEPAM 0.5 MG PO TABS: ORAL_TABLET | ORAL | Status: DC

## 2014-09-28 NOTE — Telephone Encounter (Signed)
[facility] 

## 2014-10-05 ENCOUNTER — Other Ambulatory Visit: Payer: Self-pay | Admitting: *Deleted

## 2014-10-05 MED ORDER — MORPHINE SULFATE (CONCENTRATE) 20 MG/ML PO SOLN: ORAL | Status: AC

## 2014-10-05 NOTE — Telephone Encounter (Signed)
[facility] 

## 2014-10-06 ENCOUNTER — Other Ambulatory Visit: Payer: Self-pay | Admitting: *Deleted

## 2014-10-06 MED ORDER — AMBULATORY NON FORMULARY MEDICATION: Status: DC

## 2014-10-06 NOTE — Telephone Encounter (Signed)
[facility] 

## 2014-10-15 ENCOUNTER — Non-Acute Institutional Stay (SKILLED_NURSING_FACILITY): Payer: PRIVATE HEALTH INSURANCE | Admitting: Internal Medicine

## 2014-10-15 DIAGNOSIS — F419 Anxiety disorder, unspecified: Secondary | ICD-10-CM

## 2014-10-15 DIAGNOSIS — F418 Other specified anxiety disorders: Secondary | ICD-10-CM

## 2014-10-15 DIAGNOSIS — R532 Functional quadriplegia: Secondary | ICD-10-CM

## 2014-10-15 DIAGNOSIS — E43 Unspecified severe protein-calorie malnutrition: Secondary | ICD-10-CM

## 2014-10-15 DIAGNOSIS — F32A Depression, unspecified: Secondary | ICD-10-CM

## 2014-10-15 DIAGNOSIS — F329 Major depressive disorder, single episode, unspecified: Secondary | ICD-10-CM

## 2014-10-15 NOTE — Progress Notes (Signed)
Patient ID: JADWIGA FAIDLEY, female   DOB: [birth year], 80 y.o.   MRN: [id number]    [facility]  Chief complaint- routine visit  Allergies reviewed  Code status- DNR  HPI 80 y/o female is a seen for routine visit. She had an unresponsive episode on 10/04/14. She became responsive and alert on staff's arrival. Family was contacted and comfort care was decided as the main goal of care. She was started on roxanol after end of life care discussion. She is being assisted with her meals. She appears comfortable.   ROS Unable to obtain. No new skin concern. No falls.  Medical history reviewed  Medication reviewed. See [facility]  Physical exam BP 130/68 mmHg  Pulse 76  Temp(Src) 98 F (36.7 C)  Resp 18  SpO2 95%  Constitutional: lying in bed, elderly female in no acute distress.  HENT:   Head: Normocephalic and atraumatic.   Mouth/Throat: Oropharynx is clear and moist.   Neck: Normal range of motion. Neck supple. Cardiovascular: Normal rate, regular rhythm  Pulmonary/Chest: Effort normal and breath sounds normal. No respiratory distress.   Abdominal: Soft. Bowel sounds are normal. She exhibits no mass.  Musculoskeletal: She exhibits no edema. Contracture of LUE, right 2nd PIP and 3-5 th fingers contracted, b/l foot drop, unable to move LE , hoyer transfer Lymphadenopathy:She has no cervical adenopathy.  Skin: Skin is warm and dry. She is not diaphoretic.   Labs reviewed: 02/11/13 b12 520, wbc 7.1, hb 12.1, hct 37.0, mcv 91.1, ca 9.4, na 139, k 4.2, bun 23, cr 0.82, glu 97, chol 159, tg 105, ldl 96, hdl 42 a1c 5.15.14 5.2 08/08/13 b12 349, wbc 7.4, hb 11.7, hct 38.2, plt 260, ca 9.1, na 138, k 4.2, bun 23, cr 0.7, glu 96, t.chol 165, tg 186, ldl 90, hdl 38 02/05/14 b12 1500, wbc 6.2, hb 11.8, hct 38.3, plt 258, ca 9.4, ast 15, alt 95, na 140, k 4.1, cl 104, co2 26, bun 19, cr 0.8, glu 82, t.chol 171, tg 172, ldl 92, hdl 45, a1c 5.7 08/10/14 wbc 5.8, hb 10, hct 32.5, mcv  100.3, plt 218  Assessment/plan  Severe protein calorie malnutrition On medpass supplement, continue pressure ulcer prophylaxis, assistance with feeding and total care. Poor prognosis, decline anticipated  Functional quadriplegia Muscle spasm controlled at present. currently appears comfortable with her baclofen and roxanol 5 mg q6h with prn  Anxiety and depression Continue ativan as needed and also her mood medication prozac, lamictal and risperdal.

## 2014-10-19 ENCOUNTER — Other Ambulatory Visit: Payer: Self-pay | Admitting: *Deleted

## 2014-10-19 MED ORDER — AMBULATORY NON FORMULARY MEDICATION: Status: DC

## 2014-10-19 NOTE — Telephone Encounter (Signed)
[facility] 

## 2014-11-16 ENCOUNTER — Non-Acute Institutional Stay (SKILLED_NURSING_FACILITY): Payer: PRIVATE HEALTH INSURANCE | Admitting: Internal Medicine

## 2014-11-16 DIAGNOSIS — N183 Chronic kidney disease, stage 3 unspecified: Secondary | ICD-10-CM

## 2014-11-16 DIAGNOSIS — G802 Spastic hemiplegic cerebral palsy: Secondary | ICD-10-CM

## 2014-11-16 DIAGNOSIS — I119 Hypertensive heart disease without heart failure: Secondary | ICD-10-CM

## 2014-11-19 ENCOUNTER — Other Ambulatory Visit: Payer: Self-pay | Admitting: *Deleted

## 2014-11-19 MED ORDER — FENTANYL 12 MCG/HR TD PT72: 12.5000 ug | MEDICATED_PATCH | TRANSDERMAL | Status: AC

## 2014-11-19 NOTE — Telephone Encounter (Signed)
[facility] 

## 2014-11-25 NOTE — Progress Notes (Signed)
Patient ID: Kristen Owens, female   DOB: 19-Mar-1930, 81 y.o.   MRN: [id number]    Kristen Owens  Chief complaint- medical management of chronic illness  Allergies reviewed  HPI 81 y/o female seen for routine visit. She is tolerating her medpass. Her pain is under better control as per staff. She is now on fentanyl patch and roxanol. She is in bed and is under total Owens. She is at her functional baseline with functional quadriplegia.    ROS Unable to obtain. No new skin concern. No falls. No acute behavioral concerns  Past Medical History  Diagnosis Date  . Chronic kidney disease, stage III (moderate) 10/16/2012  . Bipolar I disorder, most recent episode (or current) unspecified 06/10/2012  . Coronary atherosclerosis of native coronary artery 06/10/2012  . Unspecified constipation 06/10/2012  . Nontraumatic hematoma of soft tissue 06/10/2012  . Other dysphagia 06/10/2012  . Debility, unspecified 06/10/2012  . Unspecified vitamin D deficiency 06/07/2010  . Contracture of joint 03/02/2009  . Unspecified hereditary and idiopathic peripheral neuropathy 08/13/2008  . Other acquired deformity of ankle and foot(736.79) 08/13/2008  . Closed fracture of unspecified part of lower end of humerus 08/13/2008`  . Lumbago 05/05/2006  . Simple schizophrenia, unspecified condition 12/17/2005  . Lipoma of unspecified site 06/04/2003  . Pure hypercholesterolemia 06/04/2003  . Depressive disorder, not elsewhere classified 06/04/2003  . Diaphragmatic hernia without mention of obstruction or gangrene 06/04/2003  . Diverticulosis of colon (without mention of hemorrhage) 06/04/2003  . Alzheimer's disease 03/26/2003  . Unspecified essential hypertension 03/26/2003  . Osteoarthrosis, unspecified whether generalized or localized, unspecified site 03/26/2003  . Hypopotassemia 08/04/2002  . Osteoporosis, unspecified 08/20/2000  . Infantile cerebral palsy, unspecified 10/29/1979  .  Scoliosis (and kyphoscoliosis), idiopathic 10/29/1979  . Functional quadriplegia    Medication reviewed. See [facility]  Physical exam BP 136/80 mmHg  Pulse 76  Temp(Src) 97.8 F (36.6 C)  Resp 18  SpO2 97%  Constitutional: chronically ill appearing. in no distress.  HENT:   Head: Normocephalic and atraumatic.   Mouth/Throat: Oropharynx is clear and moist.   Eyes: Conjunctivae are normal.   Neck: Normal range of motion. Neck supple. No tracheal deviation present.   Cardiovascular: Normal rate, regular rhythm and intact distal pulses.    Pulmonary/Chest: Effort normal and breath sounds normal. No respiratory distress.   Abdominal: Soft. Bowel sounds are normal. She exhibits no mass.  Musculoskeletal: She exhibits no edema. Contracture of LUE, right 2nd PIP and 3-5 th fingers contracted, b/l foot drop, unable to move LE , hoyer transfer Lymphadenopathy:She has no cervical adenopathy.  Neurological: unable to assess Skin: Skin is warm and dry. She is not diaphoretic.    Labs reviewed: 02/11/13 b12 520, wbc 7.1, hb 12.1, hct 37.0, mcv 91.1, ca 9.4, na 139, k 4.2, bun 23, cr 0.82, glu 97, chol 159, tg 105, ldl 96, hdl 42 a1c 5.15.14 5.2 08/08/13 b12 349, wbc 7.4, hb 11.7, hct 38.2, plt 260, ca 9.1, na 138, k 4.2, bun 23, cr 0.7, glu 96, t.chol 165, tg 186, ldl 90, hdl 38 02/05/14 b12 1500, wbc 6.2, hb 11.8, hct 38.3, plt 258, ca 9.4, ast 15, alt 95, na 140, k 4.1, cl 104, co2 26, bun 19, cr 0.8, glu 82, t.chol 171, tg 172, ldl 92, hdl 45, a1c 5.7 08/08/14 a1c 5.2, t.chol 143, tg 125, ldl 76, hdl 42 08/10/14 wbc 5.8, hb 10, hct 32.5, mcv 100.3, plt 218  Assessment/plan  Spastic  hemiplegic CP Tolerating fentanyl patch 12 mcg and roxanol prn well. Continue this with baclofen and monitor  HTN bp stable, continue cozaar 25 mg daily and norvasc 5 mg daily, monitor bp  ckd stage 3 Monitor renal function. Continue cozaar.

## 2014-12-15 ENCOUNTER — Other Ambulatory Visit: Payer: Self-pay | Admitting: *Deleted

## 2014-12-15 MED ORDER — AMBULATORY NON FORMULARY MEDICATION: Status: DC

## 2014-12-15 NOTE — Telephone Encounter (Signed)
[facility] 

## 2014-12-17 ENCOUNTER — Non-Acute Institutional Stay (SKILLED_NURSING_FACILITY): Payer: Medicare Other | Admitting: Registered Nurse

## 2014-12-17 DIAGNOSIS — L89322 Pressure ulcer of left buttock, stage 2: Secondary | ICD-10-CM | POA: Diagnosis not present

## 2014-12-17 DIAGNOSIS — L304 Erythema intertrigo: Secondary | ICD-10-CM

## 2014-12-17 DIAGNOSIS — G309 Alzheimer's disease, unspecified: Secondary | ICD-10-CM | POA: Diagnosis not present

## 2014-12-17 DIAGNOSIS — G8929 Other chronic pain: Secondary | ICD-10-CM | POA: Diagnosis not present

## 2014-12-17 DIAGNOSIS — H04123 Dry eye syndrome of bilateral lacrimal glands: Secondary | ICD-10-CM | POA: Diagnosis not present

## 2014-12-17 DIAGNOSIS — R627 Adult failure to thrive: Secondary | ICD-10-CM

## 2014-12-17 DIAGNOSIS — F0280 Dementia in other diseases classified elsewhere without behavioral disturbance: Secondary | ICD-10-CM

## 2014-12-17 NOTE — Progress Notes (Signed)
Patient ID: Kristen Owens, female   DOB: [birth year], 81 y.o.   MRN: [id number]   Place of Service: [facility]  Allergies  Allergen Reactions  . Carbapenems   . Cephalosporins   . Penicillamine   . Penicillins     Code Status: DNR  Goals of Care: Comfort and Quality of Life/Hospice  Chief Complaint  Patient presents with  . Medical Management of Chronic Issues    FTT, dementia, chronic pain, dry eye    HPI 81 y.o. female with PMH of functional quadriplegia, cerebral palsy, HTN, bipolar disorder, CAD, CKD, dementia among others is being seen for a routine visit for management of her issues. She has recently transitioned from [facility] to Hospice service for noticeable health decline w/in the past 2 weeks. Has steady decline in weight and a stage 2 pressure of left buttock. No recent falls. No change in behavior or functional status reported. No concerns from staff. Seen in room today. All oral medications discontinued today except for those necessary to maintain comfort. Seen in room today. Unable to participate in ros.   Review of Systems Unable to obtain due to dementia  Past Medical History  Diagnosis Date  . Chronic kidney disease, stage III (moderate) 10/16/2012  . Bipolar I disorder, most recent episode (or current) unspecified 06/10/2012  . Coronary atherosclerosis of native coronary artery 06/10/2012  . Unspecified constipation 06/10/2012  . Nontraumatic hematoma of soft tissue 06/10/2012  . Other dysphagia 06/10/2012  . Debility, unspecified 06/10/2012  . Unspecified vitamin D deficiency 06/07/2010  . Contracture of joint 03/02/2009  . Unspecified hereditary and idiopathic peripheral neuropathy 08/13/2008  . Other acquired deformity of ankle and foot(736.79) 08/13/2008  . Closed fracture of unspecified part of lower end of humerus 08/13/2008`  . Lumbago 05/05/2006  . Simple schizophrenia, unspecified condition 12/17/2005  . Lipoma of unspecified site  06/04/2003  . Pure hypercholesterolemia 06/04/2003  . Depressive disorder, not elsewhere classified 06/04/2003  . Diaphragmatic hernia without mention of obstruction or gangrene 06/04/2003  . Diverticulosis of colon (without mention of hemorrhage) 06/04/2003  . Alzheimer's disease 03/26/2003  . Unspecified essential hypertension 03/26/2003  . Osteoarthrosis, unspecified whether generalized or localized, unspecified site 03/26/2003  . Hypopotassemia 08/04/2002  . Osteoporosis, unspecified 08/20/2000  . Infantile cerebral palsy, unspecified 10/29/1979  . Scoliosis (and kyphoscoliosis), idiopathic 10/29/1979  . Functional quadriplegia     Past Surgical History  Procedure Laterality Date  . Abdominal hysterectomy  1960  . Fracture surgery  03/12/2007    History   Social History  . Marital Status: Single    Spouse Name: N/A    Number of Children: N/A  . Years of Education: N/A   Occupational History  . Not on file.   Social History Main Topics  . Smoking status: Not on file  . Smokeless tobacco: Not on file  . Alcohol Use: Not on file  . Drug Use: Not on file  . Sexual Activity: Not on file   Other Topics Concern  . Not on file   Social History Narrative  . No narrative on file    No family history on file.    Medication List       This list is accurate as of: 12/17/14  3:52 PM.  Always use your most recent med list.               atropine 1 % ophthalmic solution  Place 1-2 drops under the tongue every 4 (four)  hours as needed.     clotrimazole-betamethasone cream  Commonly known as:  LOTRISONE  Apply 1 application topically 2 (two) times daily.     fentaNYL 12 MCG/HR  Commonly known as:  DURAGESIC - dosed mcg/hr  Place 1 patch (12.5 mcg total) onto the skin every 3 (three) days.     morphine 20 MG/ML concentrated solution  Commonly known as:  ROXANOL  Administer 0.47ml under tongue three times daily routinely for pain or shortness of breath;  Administer 0.64ml under tongue every 6 hours as needed for pain or shortness of breath (family request)     SYSTANE 0.4-0.3 % Soln  Generic drug:  Polyethyl Glycol-Propyl Glycol  Apply 1 drop to eye 2 (two) times daily.        Physical Exam  BP 122/83 mmHg  Pulse 78  Temp(Src) 98.3 F (36.8 C)  Resp 18  Ht 4\' 8"  (1.422 m)  Wt 99 lb 3.2 oz (44.997 kg)  BMI 22.25 kg/m2  Constitutional: frail/cachectic elderly female in no acute distress. Garbled speech.  HEENT: PERRL. Right eye with lateral deviation. No icterus. Oral mucosa dry Neck: No JVD or carotid bruits. Cardiac: Normal S1, S2. RRR without appreciable murmurs, rubs, or gallops. Distal pulses intact. No dependent edema.  Lungs: No respiratory distress. Breath sounds diminished bilaterally without rales, rhonchi, or wheezes. Abdomen: Audible bowel sounds in all quadrants. Soft, nontender, nondistended.  Musculoskeletal: Hand contracture bilaterally. Foot drop bilaterally Skin: Warm and dry. Skin breakdown between web of right toes and lateral-dorsal aspect of right foot. Mycotic toenails. 3.5x4x0.2cm stage 2 pressure ulcer to left buttock: 100% red wound bed, irregular shaped, semi-moist macerated wound edges. Periwound with some mild maceration and peeling skin.  Neurological: Arousable Psychiatric: Has dementia at baseline-calm  Labs Reviewed CBC Latest Ref Rng 08/08/2014  WBC - 5.8  Hemoglobin 12.0 - 16.0 g/dL 10.0(A)  Hematocrit 36 - 46 % 32(A)  Platelets 150 - 399 K/L 218    CMP Latest Ref Rng 08/08/2014  BUN 4 - 21 mg/dL 29(A)  Creatinine 0.5 - 1.1 mg/dL 0.9  Sodium 137 - 147 mmol/L 144  Potassium 3.4 - 5.3 mmol/L 4.1    Lipid Panel     Component Value Date/Time   CHOL 143 08/08/2014   TRIG 125 08/08/2014   HDL 42 08/08/2014   LDLCALC 76 08/08/2014    Lab Results  Component Value Date   HGBA1C 5.2 08/08/2014    Assessment & Plan 1. FTT (failure to thrive) in adult Progressing. Further decline  anticipated with advanced dementia. Continue current diet/supplement and provide total assist with ADL care. Continue to monitor her status  2. Pressure ulcer of left buttock, stage 2 Stable. Continue to clean wound with NS, dry, and apply skin prep to assist with increasing the tensile strength of the upper layers of skin and to serve as a barrier from urine and stool and cover with large bordered foam dressing for thermoregulation and comfort every 3 days. Continue to monitor.   3. Chronic pain Continue fentanyl patch every 72 hrs and roxanol 5mg  every six hours routinely with 5mg  every four hours as needed for pain and shortness of breath. Continue to monitor  4. Alzheimer's disease Advanced. Off of all PO meds. Continue comfort care measures and monitor.   5. Intertrigo of web of toe Lotrisone topically twice daily between right toes and top of right foot. Continue to monitor  6. Dry eyes Continue systane eye gtt to each eye twice daily.  Family/Staff Communication Plan of care discussed with nursing staff. Nursing staff verbalized understanding and agree with plan of care. No additional questions or concerns reported.    Arthur Holms, MSN, AGNP-C [facility] [address] [place], [place] [address] [phone] [8am-5pm] After hours: [phone]

## 2014-12-21 ENCOUNTER — Other Ambulatory Visit: Payer: Self-pay | Admitting: *Deleted

## 2014-12-21 MED ORDER — LORAZEPAM 0.5 MG PO TABS: ORAL_TABLET | ORAL | Status: AC

## 2014-12-21 NOTE — Telephone Encounter (Signed)
[facility] 

## 2015-01-19 DEATH — deceased
